# Patient Record
Sex: Male | Born: 1960 | Race: White | Hispanic: No | Marital: Married | Smoking: Former smoker
Health system: Southern US, Community
[De-identification: ages and names within clinical notes are randomized; demographics above are authoritative.]

## PROBLEM LIST (undated history)

## (undated) DIAGNOSIS — I10 Essential (primary) hypertension: Secondary | ICD-10-CM

## (undated) DIAGNOSIS — Z86718 Personal history of other venous thrombosis and embolism: Secondary | ICD-10-CM

## (undated) DIAGNOSIS — G8929 Other chronic pain: Secondary | ICD-10-CM

## (undated) DIAGNOSIS — G629 Polyneuropathy, unspecified: Secondary | ICD-10-CM

## (undated) DIAGNOSIS — G4730 Sleep apnea, unspecified: Secondary | ICD-10-CM

## (undated) DIAGNOSIS — E559 Vitamin D deficiency, unspecified: Secondary | ICD-10-CM

## (undated) DIAGNOSIS — M1990 Unspecified osteoarthritis, unspecified site: Secondary | ICD-10-CM

## (undated) DIAGNOSIS — D751 Secondary polycythemia: Secondary | ICD-10-CM

## (undated) DIAGNOSIS — E785 Hyperlipidemia, unspecified: Secondary | ICD-10-CM

## (undated) DIAGNOSIS — T7840XA Allergy, unspecified, initial encounter: Secondary | ICD-10-CM

## (undated) DIAGNOSIS — K219 Gastro-esophageal reflux disease without esophagitis: Secondary | ICD-10-CM

## (undated) DIAGNOSIS — M48061 Spinal stenosis, lumbar region without neurogenic claudication: Secondary | ICD-10-CM

## (undated) DIAGNOSIS — D689 Coagulation defect, unspecified: Secondary | ICD-10-CM

## (undated) DIAGNOSIS — Z860100 Personal history of colon polyps, unspecified: Secondary | ICD-10-CM

## (undated) DIAGNOSIS — M21371 Foot drop, right foot: Secondary | ICD-10-CM

## (undated) DIAGNOSIS — I872 Venous insufficiency (chronic) (peripheral): Secondary | ICD-10-CM

## (undated) DIAGNOSIS — Z5189 Encounter for other specified aftercare: Secondary | ICD-10-CM

## (undated) DIAGNOSIS — J189 Pneumonia, unspecified organism: Secondary | ICD-10-CM

## (undated) DIAGNOSIS — E538 Deficiency of other specified B group vitamins: Secondary | ICD-10-CM

## (undated) DIAGNOSIS — F32A Depression, unspecified: Secondary | ICD-10-CM

## (undated) DIAGNOSIS — K5900 Constipation, unspecified: Secondary | ICD-10-CM

## (undated) DIAGNOSIS — F329 Major depressive disorder, single episode, unspecified: Secondary | ICD-10-CM

## (undated) DIAGNOSIS — J432 Centrilobular emphysema: Secondary | ICD-10-CM

## (undated) DIAGNOSIS — M549 Dorsalgia, unspecified: Secondary | ICD-10-CM

## (undated) DIAGNOSIS — C801 Malignant (primary) neoplasm, unspecified: Secondary | ICD-10-CM

## (undated) DIAGNOSIS — G2581 Restless legs syndrome: Secondary | ICD-10-CM

## (undated) HISTORY — PX: COLONOSCOPY: SHX174

## (undated) HISTORY — DX: Secondary polycythemia: D75.1

## (undated) HISTORY — DX: Major depressive disorder, single episode, unspecified: F32.9

## (undated) HISTORY — DX: Allergy, unspecified, initial encounter: T78.40XA

## (undated) HISTORY — DX: Dorsalgia, unspecified: M54.9

## (undated) HISTORY — DX: Coagulation defect, unspecified: D68.9

## (undated) HISTORY — DX: Encounter for other specified aftercare: Z51.89

## (undated) HISTORY — DX: Other chronic pain: G89.29

## (undated) HISTORY — DX: Personal history of colonic polyps: Z86.010

## (undated) HISTORY — DX: Depression, unspecified: F32.A

## (undated) HISTORY — DX: Malignant (primary) neoplasm, unspecified: C80.1

## (undated) HISTORY — PX: OTHER SURGICAL HISTORY: SHX169

## (undated) HISTORY — PX: VASECTOMY: SHX75

## (undated) HISTORY — DX: Sleep apnea, unspecified: G47.30

## (undated) HISTORY — PX: HIP SURGERY: SHX245

---
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
## 2006-10-14 HISTORY — PX: ACHILLES TENDON SURGERY: SHX542

## 2006-12-30 ENCOUNTER — Ambulatory Visit (HOSPITAL_BASED_OUTPATIENT_CLINIC_OR_DEPARTMENT_OTHER): Admission: RE | Admit: 2006-12-30 | Discharge: 2006-12-30 | Payer: Self-pay | Admitting: Orthopedic Surgery

## 2008-11-02 ENCOUNTER — Ambulatory Visit: Payer: Self-pay | Admitting: Internal Medicine

## 2008-11-09 LAB — CBC WITH DIFFERENTIAL/PLATELET
BASO%: 0.7 % (ref 0.0–2.0)
Basophils Absolute: 0.1 10*3/uL (ref 0.0–0.1)
EOS%: 0.8 % (ref 0.0–7.0)
Eosinophils Absolute: 0.1 10*3/uL (ref 0.0–0.5)
HCT: 54.1 % — ABNORMAL HIGH (ref 38.7–49.9)
HGB: 18.6 g/dL — ABNORMAL HIGH (ref 13.0–17.1)
LYMPH%: 17.6 % (ref 14.0–48.0)
MCH: 34 pg — ABNORMAL HIGH (ref 28.0–33.4)
MCHC: 34.3 g/dL (ref 32.0–35.9)
MCV: 99.2 fL — ABNORMAL HIGH (ref 81.6–98.0)
MONO#: 0.2 10*3/uL (ref 0.1–0.9)
MONO%: 1.4 % (ref 0.0–13.0)
NEUT#: 11.7 10*3/uL — ABNORMAL HIGH (ref 1.5–6.5)
NEUT%: 79.5 % — ABNORMAL HIGH (ref 40.0–75.0)
Platelets: 131 10*3/uL — ABNORMAL LOW (ref 145–400)
RBC: 5.46 10*6/uL (ref 4.20–5.71)
RDW: 14.8 % — ABNORMAL HIGH (ref 11.2–14.6)
WBC: 14.7 10*3/uL — ABNORMAL HIGH (ref 4.0–10.0)
lymph#: 2.6 10*3/uL (ref 0.9–3.3)

## 2008-11-10 LAB — ERYTHROPOIETIN: Erythropoietin: 7.5 m[IU]/mL (ref 2.6–34.0)

## 2008-11-10 LAB — COMPREHENSIVE METABOLIC PANEL
ALT: 14 U/L (ref 0–53)
AST: 12 U/L (ref 0–37)
Albumin: 4.5 g/dL (ref 3.5–5.2)
Alkaline Phosphatase: 68 U/L (ref 39–117)
BUN: 8 mg/dL (ref 6–23)
CO2: 25 mEq/L (ref 19–32)
Calcium: 9.3 mg/dL (ref 8.4–10.5)
Chloride: 104 mEq/L (ref 96–112)
Creatinine, Ser: 0.83 mg/dL (ref 0.40–1.50)
Glucose, Bld: 104 mg/dL — ABNORMAL HIGH (ref 70–99)
Potassium: 3.8 mEq/L (ref 3.5–5.3)
Sodium: 144 mEq/L (ref 135–145)
Total Bilirubin: 0.5 mg/dL (ref 0.3–1.2)
Total Protein: 7 g/dL (ref 6.0–8.3)

## 2008-11-10 LAB — LACTATE DEHYDROGENASE: LDH: 153 U/L (ref 94–250)

## 2008-11-21 LAB — CBC WITH DIFFERENTIAL/PLATELET
BASO%: 1 % (ref 0.0–2.0)
Basophils Absolute: 0.1 10*3/uL (ref 0.0–0.1)
EOS%: 1.1 % (ref 0.0–7.0)
Eosinophils Absolute: 0.1 10*3/uL (ref 0.0–0.5)
HCT: 51.1 % — ABNORMAL HIGH (ref 38.7–49.9)
HGB: 17.8 g/dL — ABNORMAL HIGH (ref 13.0–17.1)
LYMPH%: 19.4 % (ref 14.0–48.0)
MCH: 34.6 pg — ABNORMAL HIGH (ref 28.0–33.4)
MCHC: 34.8 g/dL (ref 32.0–35.9)
MCV: 99.5 fL — ABNORMAL HIGH (ref 81.6–98.0)
MONO#: 0.3 10*3/uL (ref 0.1–0.9)
MONO%: 2.6 % (ref 0.0–13.0)
NEUT#: 10.3 10*3/uL — ABNORMAL HIGH (ref 1.5–6.5)
NEUT%: 75.9 % — ABNORMAL HIGH (ref 40.0–75.0)
Platelets: 147 10*3/uL (ref 145–400)
RBC: 5.14 10*6/uL (ref 4.20–5.71)
RDW: 15.2 % — ABNORMAL HIGH (ref 11.2–14.6)
WBC: 13.5 10*3/uL — ABNORMAL HIGH (ref 4.0–10.0)
lymph#: 2.6 10*3/uL (ref 0.9–3.3)

## 2008-11-21 LAB — LACTATE DEHYDROGENASE: LDH: 128 U/L (ref 94–250)

## 2008-11-24 LAB — JAK2 EXONS 12 & 13 MUTATION, REFLEX: JAK2 Exons 12 & 13: 13

## 2008-11-24 LAB — JAK2 GENOTYPR

## 2008-11-28 LAB — CBC WITH DIFFERENTIAL/PLATELET
BASO%: 1.2 % (ref 0.0–2.0)
Basophils Absolute: 0.1 10*3/uL (ref 0.0–0.1)
EOS%: 1.3 % (ref 0.0–7.0)
Eosinophils Absolute: 0.1 10*3/uL (ref 0.0–0.5)
HCT: 47.9 % (ref 38.7–49.9)
HGB: 16.6 g/dL (ref 13.0–17.1)
LYMPH%: 27.7 % (ref 14.0–48.0)
MCH: 35 pg — ABNORMAL HIGH (ref 28.0–33.4)
MCHC: 34.8 g/dL (ref 32.0–35.9)
MCV: 100.6 fL — ABNORMAL HIGH (ref 81.6–98.0)
MONO#: 0.2 10*3/uL (ref 0.1–0.9)
MONO%: 1.7 % (ref 0.0–13.0)
NEUT#: 6.5 10*3/uL (ref 1.5–6.5)
NEUT%: 68.1 % (ref 40.0–75.0)
Platelets: 152 10*3/uL (ref 145–400)
RBC: 4.76 10*6/uL (ref 4.20–5.71)
RDW: 16.2 % — ABNORMAL HIGH (ref 11.2–14.6)
WBC: 9.6 10*3/uL (ref 4.0–10.0)
lymph#: 2.7 10*3/uL (ref 0.9–3.3)

## 2008-12-05 LAB — LACTATE DEHYDROGENASE: LDH: 130 U/L (ref 94–250)

## 2008-12-05 LAB — CBC WITH DIFFERENTIAL/PLATELET
BASO%: 1 % (ref 0.0–2.0)
Basophils Absolute: 0.1 10*3/uL (ref 0.0–0.1)
EOS%: 1.5 % (ref 0.0–7.0)
Eosinophils Absolute: 0.2 10*3/uL (ref 0.0–0.5)
HCT: 42.7 % (ref 38.4–49.9)
HGB: 14.9 g/dL (ref 13.0–17.1)
LYMPH%: 28.4 % (ref 14.0–49.0)
MCH: 35.2 pg — ABNORMAL HIGH (ref 27.2–33.4)
MCHC: 34.9 g/dL (ref 32.0–36.0)
MCV: 100.9 fL — ABNORMAL HIGH (ref 79.3–98.0)
MONO#: 0.2 10*3/uL (ref 0.1–0.9)
MONO%: 2 % (ref 0.0–14.0)
NEUT#: 7.3 10*3/uL — ABNORMAL HIGH (ref 1.5–6.5)
NEUT%: 67.1 % (ref 39.0–75.0)
Platelets: 166 10*3/uL (ref 140–400)
RBC: 4.23 10*6/uL (ref 4.20–5.82)
RDW: 15.9 % — ABNORMAL HIGH (ref 11.0–14.6)
WBC: 10.9 10*3/uL — ABNORMAL HIGH (ref 4.0–10.3)
lymph#: 3.1 10*3/uL (ref 0.9–3.3)

## 2008-12-23 ENCOUNTER — Ambulatory Visit: Payer: Self-pay | Admitting: Internal Medicine

## 2008-12-23 LAB — CBC WITH DIFFERENTIAL/PLATELET
BASO%: 1.1 % (ref 0.0–2.0)
Basophils Absolute: 0.1 10*3/uL (ref 0.0–0.1)
EOS%: 1.8 % (ref 0.0–7.0)
Eosinophils Absolute: 0.2 10*3/uL (ref 0.0–0.5)
HCT: 45.7 % (ref 38.4–49.9)
HGB: 16.1 g/dL (ref 13.0–17.1)
LYMPH%: 27.7 % (ref 14.0–49.0)
MCH: 34.9 pg — ABNORMAL HIGH (ref 27.2–33.4)
MCHC: 35.2 g/dL (ref 32.0–36.0)
MCV: 99.3 fL — ABNORMAL HIGH (ref 79.3–98.0)
MONO#: 0.3 10*3/uL (ref 0.1–0.9)
MONO%: 3.1 % (ref 0.0–14.0)
NEUT#: 7.3 10*3/uL — ABNORMAL HIGH (ref 1.5–6.5)
NEUT%: 66.3 % (ref 39.0–75.0)
Platelets: 149 10*3/uL (ref 140–400)
RBC: 4.6 10*6/uL (ref 4.20–5.82)
RDW: 14.4 % (ref 11.0–14.6)
WBC: 10.9 10*3/uL — ABNORMAL HIGH (ref 4.0–10.3)
lymph#: 3 10*3/uL (ref 0.9–3.3)

## 2009-01-02 LAB — CBC WITH DIFFERENTIAL/PLATELET
BASO%: 0.5 % (ref 0.0–2.0)
Basophils Absolute: 0 10*3/uL (ref 0.0–0.1)
EOS%: 2.2 % (ref 0.0–7.0)
Eosinophils Absolute: 0.2 10*3/uL (ref 0.0–0.5)
HCT: 41.4 % (ref 38.4–49.9)
HGB: 14.5 g/dL (ref 13.0–17.1)
LYMPH%: 30.9 % (ref 14.0–49.0)
MCH: 34.5 pg — ABNORMAL HIGH (ref 27.2–33.4)
MCHC: 35 g/dL (ref 32.0–36.0)
MCV: 98.3 fL — ABNORMAL HIGH (ref 79.3–98.0)
MONO#: 0.2 10*3/uL (ref 0.1–0.9)
MONO%: 2.6 % (ref 0.0–14.0)
NEUT#: 5.9 10*3/uL (ref 1.5–6.5)
NEUT%: 63.8 % (ref 39.0–75.0)
Platelets: 176 10*3/uL (ref 140–400)
RBC: 4.21 10*6/uL (ref 4.20–5.82)
RDW: 13.9 % (ref 11.0–14.6)
WBC: 9.2 10*3/uL (ref 4.0–10.3)
lymph#: 2.9 10*3/uL (ref 0.9–3.3)

## 2009-01-02 LAB — LACTATE DEHYDROGENASE: LDH: 138 U/L (ref 94–250)

## 2009-01-30 LAB — CBC WITH DIFFERENTIAL/PLATELET
BASO%: 1 % (ref 0.0–2.0)
Basophils Absolute: 0.1 10*3/uL (ref 0.0–0.1)
EOS%: 3.1 % (ref 0.0–7.0)
Eosinophils Absolute: 0.3 10*3/uL (ref 0.0–0.5)
HCT: 45.9 % (ref 38.4–49.9)
HGB: 15.8 g/dL (ref 13.0–17.1)
LYMPH%: 27.2 % (ref 14.0–49.0)
MCH: 33.1 pg (ref 27.2–33.4)
MCHC: 34.4 g/dL (ref 32.0–36.0)
MCV: 96.4 fL (ref 79.3–98.0)
MONO#: 0.3 10*3/uL (ref 0.1–0.9)
MONO%: 3.3 % (ref 0.0–14.0)
NEUT#: 5.8 10*3/uL (ref 1.5–6.5)
NEUT%: 65.4 % (ref 39.0–75.0)
Platelets: 154 10*3/uL (ref 140–400)
RBC: 4.76 10*6/uL (ref 4.20–5.82)
RDW: 12.5 % (ref 11.0–14.6)
WBC: 8.9 10*3/uL (ref 4.0–10.3)
lymph#: 2.4 10*3/uL (ref 0.9–3.3)

## 2009-02-23 ENCOUNTER — Ambulatory Visit: Payer: Self-pay | Admitting: Internal Medicine

## 2009-02-27 LAB — CBC WITH DIFFERENTIAL/PLATELET
BASO%: 0.5 % (ref 0.0–2.0)
Basophils Absolute: 0 10*3/uL (ref 0.0–0.1)
EOS%: 3.3 % (ref 0.0–7.0)
Eosinophils Absolute: 0.3 10*3/uL (ref 0.0–0.5)
HCT: 45.1 % (ref 38.4–49.9)
HGB: 15.6 g/dL (ref 13.0–17.1)
LYMPH%: 32.2 % (ref 14.0–49.0)
MCH: 32.4 pg (ref 27.2–33.4)
MCHC: 34.6 g/dL (ref 32.0–36.0)
MCV: 93.5 fL (ref 79.3–98.0)
MONO#: 0.3 10*3/uL (ref 0.1–0.9)
MONO%: 4.1 % (ref 0.0–14.0)
NEUT#: 4.9 10*3/uL (ref 1.5–6.5)
NEUT%: 59.9 % (ref 39.0–75.0)
Platelets: 140 10*3/uL (ref 140–400)
RBC: 4.83 10*6/uL (ref 4.20–5.82)
RDW: 12.6 % (ref 11.0–14.6)
WBC: 8.2 10*3/uL (ref 4.0–10.3)
lymph#: 2.6 10*3/uL (ref 0.9–3.3)

## 2009-03-27 LAB — CBC WITH DIFFERENTIAL/PLATELET
BASO%: 0.5 % (ref 0.0–2.0)
Basophils Absolute: 0 10*3/uL (ref 0.0–0.1)
EOS%: 2.8 % (ref 0.0–7.0)
Eosinophils Absolute: 0.3 10*3/uL (ref 0.0–0.5)
HCT: 46.8 % (ref 38.4–49.9)
HGB: 16.8 g/dL (ref 13.0–17.1)
LYMPH%: 35 % (ref 14.0–49.0)
MCH: 32.8 pg (ref 27.2–33.4)
MCHC: 36 g/dL (ref 32.0–36.0)
MCV: 91.1 fL (ref 79.3–98.0)
MONO#: 0.4 10*3/uL (ref 0.1–0.9)
MONO%: 3.8 % (ref 0.0–14.0)
NEUT#: 5.3 10*3/uL (ref 1.5–6.5)
NEUT%: 57.9 % (ref 39.0–75.0)
Platelets: 142 10*3/uL (ref 140–400)
RBC: 5.14 10*6/uL (ref 4.20–5.82)
RDW: 12.6 % (ref 11.0–14.6)
WBC: 9.2 10*3/uL (ref 4.0–10.3)
lymph#: 3.2 10*3/uL (ref 0.9–3.3)

## 2009-03-27 LAB — LACTATE DEHYDROGENASE: LDH: 158 U/L (ref 94–250)

## 2009-04-04 ENCOUNTER — Ambulatory Visit: Payer: Self-pay | Admitting: Internal Medicine

## 2009-05-04 ENCOUNTER — Ambulatory Visit: Payer: Self-pay | Admitting: Internal Medicine

## 2009-05-04 LAB — CBC WITH DIFFERENTIAL/PLATELET
BASO%: 0.5 % (ref 0.0–2.0)
Basophils Absolute: 0 10*3/uL (ref 0.0–0.1)
EOS%: 1.7 % (ref 0.0–7.0)
Eosinophils Absolute: 0.2 10*3/uL (ref 0.0–0.5)
HCT: 46.3 % (ref 38.4–49.9)
HGB: 16.1 g/dL (ref 13.0–17.1)
LYMPH%: 26.1 % (ref 14.0–49.0)
MCH: 31.4 pg (ref 27.2–33.4)
MCHC: 34.9 g/dL (ref 32.0–36.0)
MCV: 89.9 fL (ref 79.3–98.0)
MONO#: 0.4 10*3/uL (ref 0.1–0.9)
MONO%: 4.1 % (ref 0.0–14.0)
NEUT#: 6.7 10*3/uL — ABNORMAL HIGH (ref 1.5–6.5)
NEUT%: 67.6 % (ref 39.0–75.0)
Platelets: 145 10*3/uL (ref 140–400)
RBC: 5.14 10*6/uL (ref 4.20–5.82)
RDW: 13.2 % (ref 11.0–14.6)
WBC: 9.9 10*3/uL (ref 4.0–10.3)
lymph#: 2.6 10*3/uL (ref 0.9–3.3)

## 2009-05-04 LAB — LACTATE DEHYDROGENASE: LDH: 192 U/L (ref 94–250)

## 2009-06-29 ENCOUNTER — Ambulatory Visit: Payer: Self-pay | Admitting: Internal Medicine

## 2009-07-03 LAB — CBC WITH DIFFERENTIAL/PLATELET
BASO%: 0.9 % (ref 0.0–2.0)
Basophils Absolute: 0.1 10*3/uL (ref 0.0–0.1)
EOS%: 1.9 % (ref 0.0–7.0)
Eosinophils Absolute: 0.1 10*3/uL (ref 0.0–0.5)
HCT: 45.9 % (ref 38.4–49.9)
HGB: 16.2 g/dL (ref 13.0–17.1)
LYMPH%: 30.5 % (ref 14.0–49.0)
MCH: 31.5 pg (ref 27.2–33.4)
MCHC: 35.3 g/dL (ref 32.0–36.0)
MCV: 89.3 fL (ref 79.3–98.0)
MONO#: 0.3 10*3/uL (ref 0.1–0.9)
MONO%: 5 % (ref 0.0–14.0)
NEUT#: 3.9 10*3/uL (ref 1.5–6.5)
NEUT%: 61.7 % (ref 39.0–75.0)
Platelets: 137 10*3/uL — ABNORMAL LOW (ref 140–400)
RBC: 5.14 10*6/uL (ref 4.20–5.82)
RDW: 13.4 % (ref 11.0–14.6)
WBC: 6.3 10*3/uL (ref 4.0–10.3)
lymph#: 1.9 10*3/uL (ref 0.9–3.3)

## 2009-07-03 LAB — LACTATE DEHYDROGENASE: LDH: 195 U/L (ref 94–250)

## 2009-09-28 ENCOUNTER — Ambulatory Visit: Payer: Self-pay | Admitting: Internal Medicine

## 2009-10-02 LAB — CBC WITH DIFFERENTIAL/PLATELET
BASO%: 0.5 % (ref 0.0–2.0)
Basophils Absolute: 0 10*3/uL (ref 0.0–0.1)
EOS%: 2 % (ref 0.0–7.0)
Eosinophils Absolute: 0.2 10*3/uL (ref 0.0–0.5)
HCT: 46.1 % (ref 38.4–49.9)
HGB: 16 g/dL (ref 13.0–17.1)
LYMPH%: 35.8 % (ref 14.0–49.0)
MCH: 31.5 pg (ref 27.2–33.4)
MCHC: 34.6 g/dL (ref 32.0–36.0)
MCV: 91.2 fL (ref 79.3–98.0)
MONO#: 0.2 10*3/uL (ref 0.1–0.9)
MONO%: 2.5 % (ref 0.0–14.0)
NEUT#: 5.2 10*3/uL (ref 1.5–6.5)
NEUT%: 59.2 % (ref 39.0–75.0)
Platelets: 148 10*3/uL (ref 140–400)
RBC: 5.06 10*6/uL (ref 4.20–5.82)
RDW: 13.6 % (ref 11.0–14.6)
WBC: 8.7 10*3/uL (ref 4.0–10.3)
lymph#: 3.1 10*3/uL (ref 0.9–3.3)

## 2009-10-02 LAB — LACTATE DEHYDROGENASE: LDH: 172 U/L (ref 94–250)

## 2009-11-29 ENCOUNTER — Ambulatory Visit: Payer: Self-pay | Admitting: Internal Medicine

## 2009-12-04 LAB — CBC WITH DIFFERENTIAL/PLATELET
BASO%: 0.7 % (ref 0.0–2.0)
Basophils Absolute: 0.1 10*3/uL (ref 0.0–0.1)
EOS%: 1.4 % (ref 0.0–7.0)
Eosinophils Absolute: 0.1 10*3/uL (ref 0.0–0.5)
HCT: 45.8 % (ref 38.4–49.9)
HGB: 16.1 g/dL (ref 13.0–17.1)
LYMPH%: 40.5 % (ref 14.0–49.0)
MCH: 32.4 pg (ref 27.2–33.4)
MCHC: 35.2 g/dL (ref 32.0–36.0)
MCV: 92 fL (ref 79.3–98.0)
MONO#: 0.4 10*3/uL (ref 0.1–0.9)
MONO%: 4.5 % (ref 0.0–14.0)
NEUT#: 4.6 10*3/uL (ref 1.5–6.5)
NEUT%: 52.9 % (ref 39.0–75.0)
Platelets: 139 10*3/uL — ABNORMAL LOW (ref 140–400)
RBC: 4.98 10*6/uL (ref 4.20–5.82)
RDW: 13.7 % (ref 11.0–14.6)
WBC: 8.7 10*3/uL (ref 4.0–10.3)
lymph#: 3.5 10*3/uL — ABNORMAL HIGH (ref 0.9–3.3)

## 2009-12-04 LAB — LACTATE DEHYDROGENASE: LDH: 175 U/L (ref 94–250)

## 2010-02-23 ENCOUNTER — Ambulatory Visit: Payer: Self-pay | Admitting: Internal Medicine

## 2010-02-26 LAB — CBC WITH DIFFERENTIAL/PLATELET
BASO%: 0.4 % (ref 0.0–2.0)
Basophils Absolute: 0 10*3/uL (ref 0.0–0.1)
EOS%: 1.7 % (ref 0.0–7.0)
Eosinophils Absolute: 0.1 10*3/uL (ref 0.0–0.5)
HCT: 44.8 % (ref 38.4–49.9)
HGB: 15.7 g/dL (ref 13.0–17.1)
LYMPH%: 36.5 % (ref 14.0–49.0)
MCH: 32.1 pg (ref 27.2–33.4)
MCHC: 35.2 g/dL (ref 32.0–36.0)
MCV: 91.3 fL (ref 79.3–98.0)
MONO#: 0.3 10*3/uL (ref 0.1–0.9)
MONO%: 3.5 % (ref 0.0–14.0)
NEUT#: 4.4 10*3/uL (ref 1.5–6.5)
NEUT%: 57.9 % (ref 39.0–75.0)
Platelets: 137 10*3/uL — ABNORMAL LOW (ref 140–400)
RBC: 4.9 10*6/uL (ref 4.20–5.82)
RDW: 13.1 % (ref 11.0–14.6)
WBC: 7.6 10*3/uL (ref 4.0–10.3)
lymph#: 2.8 10*3/uL (ref 0.9–3.3)

## 2010-02-26 LAB — LACTATE DEHYDROGENASE: LDH: 172 U/L (ref 94–250)

## 2011-01-04 ENCOUNTER — Emergency Department: Payer: Self-pay | Admitting: Emergency Medicine

## 2011-01-16 LAB — LIPID PANEL
Cholesterol: 165 mg/dL (ref 0–200)
HDL: 31 mg/dL — AB (ref 35–70)
LDL Cholesterol: 108 mg/dL
Triglycerides: 130 mg/dL (ref 40–160)

## 2011-03-01 NOTE — Op Note (Signed)
NAME:  Ralph [name], Ralph [name]                 ACCOUNT NO.:  [id number]   MEDICAL RECORD NO.:  [id number]          PATIENT TYPE:  AMB   LOCATION:  [facility]                          FACILITY:  [facility]   PHYSICIAN:  Leonides Grills, M.D.     DATE OF BIRTH:  October 24, 1960   DATE OF PROCEDURE:  12/30/2006  DATE OF DISCHARGE:  12/30/2006                               OPERATIVE REPORT   PREOPERATIVE DIAGNOSIS:  Right tight Achilles tendon.   POSTOPERATIVE DIAGNOSIS:  Right tight Achilles tendon.   OPERATION:  Right percutaneous tendoachilles lengthening.   ANESTHESIA:  General.   SURGEON:  Leonides Grills, MD.   ASSISTANT:  Evlyn Kanner, P.A.-C.   ESTIMATED BLOOD LOSS:  Minimal.   TOURNIQUET:  None.   COMPLICATIONS:  None.   DISPOSITION:  Stable to PR.   INDICATIONS:  This gentleman has had a tight heel cord.  He was  consented for the above procedure.  All risks which include infection,  nerve or vessel injury, Achilles tendon rupture, persistent, worsening,  prolonged recovery, stiffness were all explained.  Questions were  encouraged answered.   DESCRIPTION OF PROCEDURE:  The patient brought to the operating room,  placed in supine position.  Initially after adequate general  endotracheal tube anesthesia was administered as well as Ancef 1 g IV  piggyback, right lower extremity was then prepped and draped in sterile  manner.  No tourniquet was used.  Two medial and one lateral  hemisections of the Achilles tendon with 11 blade scalpel was then made.  This had excellent release of the tight Achilles tendon.  The area was  copiously irrigated with normal saline.  Sterile dressing was applied.  Cam Walker boot was applied.  The patient was stable to PR.   POSTOPERATIVE COURSE:  The patient will follow up in 2 weeks.  At that  time, we will remove the dressing as well as suture.  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  At  that point, he will go into a normal  shoe and progress slowly,  activities as tolerated.      Leonides Grills, M.D.  Electronically Signed     PB/MEDQ  D:  01/12/2007  T:  01/12/2007  Job:  [id number]

## 2011-06-12 ENCOUNTER — Other Ambulatory Visit: Payer: Self-pay | Admitting: Internal Medicine

## 2011-06-12 DIAGNOSIS — M4316 Spondylolisthesis, lumbar region: Secondary | ICD-10-CM

## 2011-06-21 ENCOUNTER — Inpatient Hospital Stay: Admission: RE | Admit: 2011-06-21 | Payer: Self-pay | Source: Ambulatory Visit

## 2011-07-03 ENCOUNTER — Ambulatory Visit
Admission: RE | Admit: 2011-07-03 | Discharge: 2011-07-03 | Disposition: A | Discharge status: Home / Self Care | Payer: BC Managed Care – PPO | Source: Ambulatory Visit | Attending: Internal Medicine | Admitting: Internal Medicine

## 2011-07-03 DIAGNOSIS — M4316 Spondylolisthesis, lumbar region: Secondary | ICD-10-CM

## 2011-08-13 LAB — CBC AND DIFFERENTIAL: HCT: 51 % (ref 41–53)

## 2011-11-10 DIAGNOSIS — M21371 Foot drop, right foot: Secondary | ICD-10-CM | POA: Insufficient documentation

## 2011-11-10 DIAGNOSIS — E785 Hyperlipidemia, unspecified: Secondary | ICD-10-CM | POA: Insufficient documentation

## 2011-11-10 DIAGNOSIS — F32A Depression, unspecified: Secondary | ICD-10-CM | POA: Insufficient documentation

## 2011-11-10 DIAGNOSIS — K5909 Other constipation: Secondary | ICD-10-CM | POA: Insufficient documentation

## 2011-11-10 DIAGNOSIS — F329 Major depressive disorder, single episode, unspecified: Secondary | ICD-10-CM

## 2011-11-10 DIAGNOSIS — K219 Gastro-esophageal reflux disease without esophagitis: Secondary | ICD-10-CM

## 2011-11-10 DIAGNOSIS — G2581 Restless legs syndrome: Secondary | ICD-10-CM | POA: Insufficient documentation

## 2011-11-10 DIAGNOSIS — D751 Secondary polycythemia: Secondary | ICD-10-CM | POA: Insufficient documentation

## 2011-11-25 ENCOUNTER — Ambulatory Visit (INDEPENDENT_AMBULATORY_CARE_PROVIDER_SITE_OTHER): Payer: BC Managed Care – PPO | Admitting: Family Medicine

## 2011-11-25 ENCOUNTER — Encounter: Payer: Self-pay | Admitting: Family Medicine

## 2011-11-25 DIAGNOSIS — J329 Chronic sinusitis, unspecified: Secondary | ICD-10-CM

## 2011-11-25 DIAGNOSIS — F32A Depression, unspecified: Secondary | ICD-10-CM

## 2011-11-25 DIAGNOSIS — M21371 Foot drop, right foot: Secondary | ICD-10-CM

## 2011-11-25 DIAGNOSIS — D751 Secondary polycythemia: Secondary | ICD-10-CM

## 2011-11-25 DIAGNOSIS — F329 Major depressive disorder, single episode, unspecified: Secondary | ICD-10-CM

## 2011-11-25 DIAGNOSIS — Z0000 Encounter for general adult medical examination without abnormal findings: Secondary | ICD-10-CM

## 2011-11-25 DIAGNOSIS — E785 Hyperlipidemia, unspecified: Secondary | ICD-10-CM

## 2011-11-25 DIAGNOSIS — F411 Generalized anxiety disorder: Secondary | ICD-10-CM

## 2011-11-25 DIAGNOSIS — M7702 Medial epicondylitis, left elbow: Secondary | ICD-10-CM

## 2011-11-25 LAB — POCT CBC
Granulocyte percent: 56.2 %G (ref 37–80)
HCT, POC: 50.3 % (ref 43.5–53.7)
Hemoglobin: 17.1 g/dL (ref 14.1–18.1)
Lymph, poc: 2.5 (ref 0.6–3.4)
MCH, POC: 32.3 pg — AB (ref 27–31.2)
MCHC: 34 g/dL (ref 31.8–35.4)
MCV: 95 fL (ref 80–97)
MID (cbc): 0.5 (ref 0–0.9)
MPV: 8.6 fL (ref 0–99.8)
POC Granulocyte: 3.8 (ref 2–6.9)
POC LYMPH PERCENT: 36.4 %L (ref 10–50)
POC MID %: 7.4 %M (ref 0–12)
Platelet Count, POC: 188 10*3/uL (ref 142–424)
RBC: 5.3 M/uL (ref 4.69–6.13)
RDW, POC: 14.2 %
WBC: 6.8 10*3/uL (ref 4.6–10.2)

## 2011-11-25 LAB — TSH: TSH: 1.122 u[IU]/mL (ref 0.350–4.500)

## 2011-11-25 MED ORDER — AZITHROMYCIN 250 MG PO TABS: ORAL_TABLET | ORAL | Status: AC

## 2011-11-25 NOTE — Progress Notes (Signed)
  Subjective:    Patient ID: Ralph [name], male    DOB: [birth year], 51 y.o.   MRN: [id number]  HPI  Patient presents for CPE  See blue intake form(to be scanned)  1) C/o (L) elbow pain in the evening. Uses computer mouse throughout day.  Have not yet tried NSAIDS.  2) C/o green nasal discharge that is occ blood tinged.  Denies facial or dental pain.  Review of Systems     Objective:   Physical Exam  Constitutional: He is oriented to person, place, and time. He appears well-developed and well-nourished.  HENT:  Head: Normocephalic and atraumatic.  Right Ear: External ear normal.  Left Ear: External ear normal.  Nose: Nose normal.  Mouth/Throat: Oropharynx is clear and moist.  Eyes: Conjunctivae and EOM are normal. Pupils are equal, round, and reactive to light.  Neck: Normal range of motion. Neck supple. Carotid bruit is not present. No thyromegaly present.  Cardiovascular: Normal rate, regular rhythm, normal heart sounds and intact distal pulses.   Pulmonary/Chest: Effort normal and breath sounds normal.  Abdominal: Soft. Bowel sounds are normal. He exhibits no mass. There is no hepatosplenomegaly. There is no tenderness.  Genitourinary: Prostate is enlarged (1+ enlarged without nodules).  Lymphadenopathy:    He has no cervical adenopathy.  Neurological: He is alert and oriented to person, place, and time. He exhibits abnormal muscle tone.       (R) foot drop  Skin: Skin is warm.  Psychiatric: He has a normal mood and affect.    Results for orders placed in visit on 11/25/11  POCT CBC      Component Value Range   WBC 6.8  4.6 - 10.2 (K/uL)   Lymph, poc 2.5  0.6 - 3.4    POC LYMPH PERCENT 36.4  10 - 50 (%L)   MID (cbc) 0.5  0 - 0.9    POC MID % 7.4  0 - 12 (%M)   POC Granulocyte 3.8  2 - 6.9    Granulocyte percent 56.2  37 - 80 (%G)   RBC 5.30  4.69 - 6.13 (M/uL)   Hemoglobin 17.1  14.1 - 18.1 (g/dL)   HCT, POC 40.9  81.1 - 53.7 (%)   MCV 95.0  80 - 97 (fL)   MCH,  POC 32.3 (*) 27 - 31.2 (pg)   MCHC 34.0  31.8 - 35.4 (g/dL)   RDW, POC 91.4     Platelet Count, POC 188  142 - 424 (K/uL)   MPV 8.6  0 - 99.8 (fL)            1. Routine general medical examination at a health care facility  PSA  2. Polycythemia  POCT CBC, patient to contact Dr. Shirline Frees for phlebotomy given current HCT  3. Depression  TSH  4. Dyslipidemia  Comprehensive metabolic panel, Lipid panel  5. Sinusitis  azithromycin (ZITHROMAX) 250 MG tablet  6. Medial epicondylitis of left elbow  Trial of Advil INB RTC  7. Foot drop, right  chronic   Anticipatory guidance

## 2011-11-26 LAB — LIPID PANEL
Cholesterol: 174 mg/dL (ref 0–200)
HDL: 41 mg/dL (ref >39)
LDL Cholesterol: 111 mg/dL — ABNORMAL HIGH (ref 0–99)
Total CHOL/HDL Ratio: 4.2 Ratio
Triglycerides: 110 mg/dL (ref <150)
VLDL: 22 mg/dL (ref 0–40)

## 2011-11-26 LAB — PSA: PSA: 2.35 ng/mL (ref <=4.00)

## 2011-11-26 LAB — COMPREHENSIVE METABOLIC PANEL
ALT: 14 U/L (ref 0–53)
AST: 17 U/L (ref 0–37)
Albumin: 4.5 g/dL (ref 3.5–5.2)
Alkaline Phosphatase: 83 U/L (ref 39–117)
BUN: 10 mg/dL (ref 6–23)
CO2: 24 mEq/L (ref 19–32)
Calcium: 9 mg/dL (ref 8.4–10.5)
Chloride: 103 mEq/L (ref 96–112)
Creat: 1 mg/dL (ref 0.50–1.35)
Glucose, Bld: 115 mg/dL — ABNORMAL HIGH (ref 70–99)
Potassium: 4.3 mEq/L (ref 3.5–5.3)
Sodium: 140 mEq/L (ref 135–145)
Total Bilirubin: 0.7 mg/dL (ref 0.3–1.2)
Total Protein: 6.8 g/dL (ref 6.0–8.3)

## 2011-11-28 ENCOUNTER — Telehealth: Payer: Self-pay | Admitting: Internal Medicine

## 2011-11-28 ENCOUNTER — Other Ambulatory Visit: Payer: Self-pay | Admitting: *Deleted

## 2011-11-28 DIAGNOSIS — D45 Polycythemia vera: Secondary | ICD-10-CM

## 2011-11-28 NOTE — Telephone Encounter (Signed)
l/m with 3/14 appt     aom

## 2011-11-28 NOTE — Progress Notes (Signed)
Pt called stating that he had a CBC done at his PCP office and his hbg was 17.1.  Pt last seen by Dr Donnald Garre in 2011.  Per Dr Donnald Garre okay to schedule lab and f/u appt in March 2013.  SLJ

## 2011-11-28 NOTE — Progress Notes (Signed)
Quick Note:  Please send patient copy of normal labs. Thanks! KR  ______ 

## 2011-12-26 ENCOUNTER — Ambulatory Visit (HOSPITAL_BASED_OUTPATIENT_CLINIC_OR_DEPARTMENT_OTHER): Payer: BC Managed Care – PPO | Admitting: Internal Medicine

## 2011-12-26 ENCOUNTER — Other Ambulatory Visit (HOSPITAL_BASED_OUTPATIENT_CLINIC_OR_DEPARTMENT_OTHER): Payer: BC Managed Care – PPO | Admitting: Lab

## 2011-12-26 VITALS — BP 132/87 | HR 77 | Temp 97.3°F | Ht 70.0 in | Wt 212.9 lb

## 2011-12-26 DIAGNOSIS — D751 Secondary polycythemia: Secondary | ICD-10-CM

## 2011-12-26 DIAGNOSIS — D45 Polycythemia vera: Secondary | ICD-10-CM

## 2011-12-26 LAB — COMPREHENSIVE METABOLIC PANEL
ALT: 14 U/L (ref 0–53)
AST: 14 U/L (ref 0–37)
Albumin: 4.4 g/dL (ref 3.5–5.2)
Alkaline Phosphatase: 84 U/L (ref 39–117)
BUN: 10 mg/dL (ref 6–23)
CO2: 27 mEq/L (ref 19–32)
Calcium: 9.1 mg/dL (ref 8.4–10.5)
Chloride: 103 mEq/L (ref 96–112)
Creatinine, Ser: 1.21 mg/dL (ref 0.50–1.35)
Glucose, Bld: 106 mg/dL — ABNORMAL HIGH (ref 70–99)
Potassium: 4.2 mEq/L (ref 3.5–5.3)
Sodium: 139 mEq/L (ref 135–145)
Total Bilirubin: 0.5 mg/dL (ref 0.3–1.2)
Total Protein: 6.4 g/dL (ref 6.0–8.3)

## 2011-12-26 LAB — CBC WITH DIFFERENTIAL/PLATELET
BASO%: 1.8 % (ref 0.0–2.0)
Basophils Absolute: 0.1 10*3/uL (ref 0.0–0.1)
EOS%: 1.5 % (ref 0.0–7.0)
Eosinophils Absolute: 0.1 10*3/uL (ref 0.0–0.5)
HCT: 47.4 % (ref 38.4–49.9)
HGB: 16.5 g/dL (ref 13.0–17.1)
LYMPH%: 35.1 % (ref 14.0–49.0)
MCH: 32.8 pg (ref 27.2–33.4)
MCHC: 34.8 g/dL (ref 32.0–36.0)
MCV: 94.3 fL (ref 79.3–98.0)
MONO#: 0.2 10*3/uL (ref 0.1–0.9)
MONO%: 2.9 % (ref 0.0–14.0)
NEUT#: 4.4 10*3/uL (ref 1.5–6.5)
NEUT%: 58.7 % (ref 39.0–75.0)
Platelets: 148 10*3/uL (ref 140–400)
RBC: 5.03 10*6/uL (ref 4.20–5.82)
RDW: 13.5 % (ref 11.0–14.6)
WBC: 7.6 10*3/uL (ref 4.0–10.3)
lymph#: 2.7 10*3/uL (ref 0.9–3.3)
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
## 2012-02-15 ENCOUNTER — Other Ambulatory Visit: Payer: Self-pay | Admitting: Physician Assistant

## 2012-02-23 ENCOUNTER — Other Ambulatory Visit: Payer: Self-pay | Admitting: Physician Assistant

## 2012-03-16 ENCOUNTER — Ambulatory Visit (INDEPENDENT_AMBULATORY_CARE_PROVIDER_SITE_OTHER): Payer: BC Managed Care – PPO | Admitting: Family Medicine

## 2012-03-16 VITALS — BP 132/77 | HR 84 | Temp 98.5°F | Resp 18 | Ht 70.0 in | Wt 213.0 lb

## 2012-03-16 DIAGNOSIS — T148 Other injury of unspecified body region: Secondary | ICD-10-CM

## 2012-03-16 DIAGNOSIS — W57XXXA Bitten or stung by nonvenomous insect and other nonvenomous arthropods, initial encounter: Secondary | ICD-10-CM

## 2012-03-16 MED ORDER — DOXYCYCLINE HYCLATE 100 MG PO TABS
100.0000 mg | ORAL_TABLET | Freq: Two times a day (BID) | ORAL | Status: AC
Start: 2012-03-16 — End: 2012-03-26

## 2012-03-16 NOTE — Patient Instructions (Signed)
Wood Tick Bite Ticks are insects that attach themselves to the skin. Most tick bites are harmless, but sometimes ticks carry diseases that can make a person quite ill. The chance of getting ill depends on:  The kind of tick that bites you.   Time of year.   How long the tick is attached.   Geographic location.  Wood ticks are also called dog ticks. They are generally black. They can have white markings. They live in shrubs and grassy areas. They are larger than deer ticks. Wood ticks are about the size of a watermelon seed. They have a hard body. The most common places for ticks to attach themselves are the scalp, neck, armpits, waist, and groin. Wood ticks may stay attached for up to 2 weeks. TICKS MUST BE REMOVED AS SOON AS POSSIBLE TO HELP PREVENT DISEASES CAUSED BY TICK BITES.  TO REMOVE A TICK: 1. If available, put on latex gloves before trying to remove a tick.  2. Grasp the tick as close to the skin as possible, with curved forceps, fine tweezers or a special tick removal tool.  3. Pull gently with steady pressure until the tick lets go. Do not twist the tick or jerk it suddenly. This may break off the tick's head or mouth parts.  4. Do not crush the tick's body. This could force disease-carrying fluids from the tick into your body.  5. After the tick is removed, wash the bite area and your hands with soap and water or other disinfectant.  6. Apply a small amount of antiseptic cream or ointment to the bite site.  7. Wash and disinfect any instruments that were used.  8. Save the tick in a jar or plastic bag for later identification. Preserve the tick with a bit of alcohol or put it in the freezer.  9. Do not apply a hot match, petroleum jelly, or fingernail polish to the tick. This does not work and may increase the chances of disease from the tick bite.  YOU MAY NEED TO SEE YOUR CAREGIVER FOR A TETANUS SHOT NOW IF:  You have no idea when you had the last one.   You have never had a  tetanus shot before.  If you need a tetanus shot, and you decide not to get one, there is a rare chance of getting tetanus. Sickness from tetanus can be serious. If you get a tetanus shot, your arm may swell, get red and warm to the touch at the shot site. This is common and not a problem. PREVENTION  Wear protective clothing. Long sleeves and pants are best.   Wear white clothes to see ticks more easily   Tuck your pant legs into your socks.   If walking on trail, stay in the middle of the trail to avoid brushing against bushes.   Put insect repellent on all exposed skin and along boot tops, pant legs and sleeve cuffs   Check clothing, hair and skin repeatedly and before coming inside.   Brush off any ticks that are not attached.  SEEK MEDICAL CARE IF:   You cannot remove a tick or part of the tick that is left in the skin.   Unexplained fever.   Redness and swelling in the area of the tick bite.   Tender, swollen lymph glands.   Diarrhea.   Weight loss.   Cough.   Fatigue.   Muscle, joint or bone pain.   Belly pain.   Headache.   Rash.    SEEK IMMEDIATE MEDICAL CARE IF:   You develop an oral temperature above 102 F (38.9 C).   You are having trouble walking or moving your legs.   Numbness in the legs.   Shortness of breath.   Confusion.   Repeated vomiting.  Document Released: 09/27/2000 Document Revised: 09/19/2011 Document Reviewed: 09/05/2008 [employer] Patient Information 2012 [employer].  Deer Tick Bite Deer ticks are brown arachnids (spider family) that vary in size from as small as the head of a pin to 1/4 inch (1/2 cm) diameter. They thrive in wooded areas. Deer are the preferred host of adult deer ticks. Small rodents are the host of young ticks (nymphs). When a person walks in a field or wooded area, young and adult ticks in the surrounding grass and vegetation can attach themselves to the skin. They can suck blood for hours to days if  unnoticed. Ticks are found all over the [place] Some ticks carry a specific bacteria (Borrelia burgdorferi) that causes an infection called Lyme disease. The bacteria is typically passed into a person during the blood sucking process. This happens after the tick has been attached for at least a number of hours. While ticks can be found all over the [place] those carrying the bacteria that causes Lyme disease are most common in [place] and the [place]. Only a small proportion of ticks in these areas carry the Lyme disease bacteria and cause human infections. Ticks usually attach to warm spots on the body, such as the:  Head.   Back.   Neck.   Armpits.   Groin.  SYMPTOMS  Most of the time, a deer tick bite will not be felt. You may or may not see the attached tick. You may notice mild irritation or redness around the bite site. If the deer tick passes the Lyme disease bacteria to a person, a round, red rash may be noticed 2 to 3 days after the bite. The rash may be clear in the middle, like a bull's-eye or target. If not treated, other symptoms may develop several days to weeks after the onset of the rash. These symptoms may include:  New rash lesions.   Fatigue and weakness.   General ill feeling and achiness.   Chills.   Headache and neck pain.   Swollen lymph glands.   Sore muscles and joints.  5 to 15% of untreated people with Lyme disease may develop more severe illnesses after several weeks to months. This may include inflammation of the brain lining (meningitis), nerve palsies, an abnormal heartbeat, or severe muscle and joint pain and inflammation (myositis or arthritis). DIAGNOSIS   Physical exam and medical history.   Viewing the tick if it was saved for confirmation.   Blood tests (to check or confirm the presence of Lyme disease).  TREATMENT  Most ticks do not carry disease. If found, an attached tick should be removed using tweezers. Tweezers should be placed under  the body of the tick so it is removed by its attachment parts (pincers). If there are signs or symptoms of being sick, or Lyme disease is confirmed, medicines (antibiotics) that kill germs are usually prescribed. In more severe cases, antibiotics may be given through an intravenous (IV) access. HOME CARE INSTRUCTIONS   Always remove ticks with tweezers. Do not use petroleum jelly or other methods to kill or remove the tick. Slide the tweezers under the body and pull out as much as you can. If you are not sure what it is,   save it in a jar and show your caregiver.   Once you remove the tick, the skin will heal on its own. Wash your hands and the affected area with water and soap. You may place a bandage on the affected area.   Take medicine as directed. You may be advised to take a full course of antibiotics.   Follow up with your caregiver as recommended.  FINDING OUT THE RESULTS OF YOUR TEST Not all test results are available during your visit. If your test results are not back during the visit, make an appointment with your caregiver to find out the results. Do not assume everything is normal if you have not heard from your caregiver or the medical facility. It is important for you to follow up on all of your test results. PROGNOSIS  If Lyme disease is confirmed, early treatment with antibiotics is very effective. Following preventive guidelines is important since it is possible to get the disease more than once. PREVENTION   Wear long sleeves and long pants in wooded or grassy areas. Tuck your pants into your socks.   Use an insect repellent while hiking.   Check yourself, your children, and your pets regularly for ticks after playing outside.   Clear piles of leaves or brush from your yard. Ticks might live there.  SEEK MEDICAL CARE IF:   You or your child has an oral temperature above 102 F (38.9 C).   You develop a severe headache following the bite.   You feel generally ill.    You notice a rash.   You are having trouble removing the tick.   The bite area has red skin or yellow drainage.  SEEK IMMEDIATE MEDICAL CARE IF:   Your face is weak and droopy or you have other neurological symptoms.   You have severe joint pain or weakness.  MAKE SURE YOU:   Understand these instructions.   Will watch your condition.   Will get help right away if you are not doing well or get worse.  FOR MORE INFORMATION Centers for Disease Control and Prevention: www.cdc.gov American Academy [employer]: www.aafp.org Document Released: 12/25/2009 Document Revised: 09/19/2011 Document Reviewed: 12/25/2009 [employer] Patient Information 2012 [employer]. 

## 2012-03-16 NOTE — Progress Notes (Signed)
Is a 51 year old man who found a tick on his left chest today's ago. He's able to remove it but he's starting to have redness and ulceration just superior to the left areola. He's had no fever no other rash, no headache and no stiff neck.  Objective: 3 mm ulcerated area superior to the left areola without induration, no other rash or  Patient's alert and in no distress. No other rashes identified  Assessment: Uncomplicated tick bite  Plan doxycycline 100 mg twice a day x10 days. Patient Instructions  Wood Tick Bite Ticks are insects that attach themselves to the skin. Most tick bites are harmless, but sometimes ticks carry diseases that can make a person quite ill. The chance of getting ill depends on:  The kind of tick that bites you.   Time of year.   How long the tick is attached.   Geographic location.  Wood ticks are also called dog ticks. They are generally black. They can have white markings. They live in shrubs and grassy areas. They are larger than deer ticks. Wood ticks are about the size of a watermelon seed. They have a hard body. The most common places for ticks to attach themselves are the scalp, neck, armpits, waist, and groin. Wood ticks may stay attached for up to 2 weeks. TICKS MUST BE REMOVED AS SOON AS POSSIBLE TO HELP PREVENT DISEASES CAUSED BY TICK BITES.  TO REMOVE A TICK: 1. If available, put on latex gloves before trying to remove a tick.  2. Grasp the tick as close to the skin as possible, with curved forceps, fine tweezers or a special tick removal tool.  3. Pull gently with steady pressure until the tick lets go. Do not twist the tick or jerk it suddenly. This may break off the tick's head or mouth parts.  4. Do not crush the tick's body. This could force disease-carrying fluids from the tick into your body.  5. After the tick is removed, wash the bite area and your hands with soap and water or other disinfectant.  6. Apply a small amount of antiseptic cream  or ointment to the bite site.  7. Wash and disinfect any instruments that were used.  8. Save the tick in a jar or plastic bag for later identification. Preserve the tick with a bit of alcohol or put it in the freezer.  9. Do not apply a hot match, petroleum jelly, or fingernail polish to the tick. This does not work and may increase the chances of disease from the tick bite.  YOU MAY NEED TO SEE YOUR CAREGIVER FOR A TETANUS SHOT NOW IF:  You have no idea when you had the last one.   You have never had a tetanus shot before.  If you need a tetanus shot, and you decide not to get one, there is a rare chance of getting tetanus. Sickness from tetanus can be serious. If you get a tetanus shot, your arm may swell, get red and warm to the touch at the shot site. This is common and not a problem. PREVENTION  Wear protective clothing. Long sleeves and pants are best.   Wear white clothes to see ticks more easily   Tuck your pant legs into your socks.   If walking on trail, stay in the middle of the trail to avoid brushing against bushes.   Put insect repellent on all exposed skin and along boot tops, pant legs and sleeve cuffs   Check clothing, hair  and skin repeatedly and before coming inside.   Brush off any ticks that are not attached.  SEEK MEDICAL CARE IF:   You cannot remove a tick or part of the tick that is left in the skin.   Unexplained fever.   Redness and swelling in the area of the tick bite.   Tender, swollen lymph glands.   Diarrhea.   Weight loss.   Cough.   Fatigue.   Muscle, joint or bone pain.   Belly pain.   Headache.   Rash.  SEEK IMMEDIATE MEDICAL CARE IF:   You develop an oral temperature above 102 F (38.9 C).   You are having trouble walking or moving your legs.   Numbness in the legs.   Shortness of breath.   Confusion.   Repeated vomiting.  Document Released: 09/27/2000 Document Revised: 09/19/2011 Document Reviewed:  09/05/2008 [facility] Patient Information 2012 [place], [place].Deer Tick Bite Deer ticks are brown arachnids (spider family) that vary in size from as small as the head of a pin to 1/4 inch (1/2 cm) diameter. They thrive in wooded areas. Deer are the preferred host of adult deer ticks. Small rodents are the host of young ticks (nymphs). When a person walks in a field or wooded area, young and adult ticks in the surrounding grass and vegetation can attach themselves to the skin. They can suck blood for hours to days if unnoticed. Ticks are found all over the [place] Some ticks carry a specific bacteria (Borrelia burgdorferi) that causes an infection called Lyme disease. The bacteria is typically passed into a person during the blood sucking process. This happens after the tick has been attached for at least a number of hours. While ticks can be found all over the [place] those carrying the bacteria that causes Lyme disease are most common in [place] and the [place]. Only a small proportion of ticks in these areas carry the Lyme disease bacteria and cause human infections. Ticks usually attach to warm spots on the body, such as the:  Head.   Back.   Neck.   Armpits.   Groin.  SYMPTOMS  Most of the time, a deer tick bite will not be felt. You may or may not see the attached tick. You may notice mild irritation or redness around the bite site. If the deer tick passes the Lyme disease bacteria to a person, a round, red rash may be noticed 2 to 3 days after the bite. The rash may be clear in the middle, like a bull's-eye or target. If not treated, other symptoms may develop several days to weeks after the onset of the rash. These symptoms may include:  New rash lesions.   Fatigue and weakness.   General ill feeling and achiness.   Chills.   Headache and neck pain.   Swollen lymph glands.   Sore muscles and joints.  5 to 15% of untreated people with Lyme disease may develop more severe  illnesses after several weeks to months. This may include inflammation of the brain lining (meningitis), nerve palsies, an abnormal heartbeat, or severe muscle and joint pain and inflammation (myositis or arthritis). DIAGNOSIS   Physical exam and medical history.   Viewing the tick if it was saved for confirmation.   Blood tests (to check or confirm the presence of Lyme disease).  TREATMENT  Most ticks do not carry disease. If found, an attached tick should be removed using tweezers. Tweezers should be placed under the body of the  tick so it is removed by its attachment parts (pincers). If there are signs or symptoms of being sick, or Lyme disease is confirmed, medicines (antibiotics) that kill germs are usually prescribed. In more severe cases, antibiotics may be given through an intravenous (IV) access. HOME CARE INSTRUCTIONS   Always remove ticks with tweezers. Do not use petroleum jelly or other methods to kill or remove the tick. Slide the tweezers under the body and pull out as much as you can. If you are not sure what it is, save it in a jar and show your caregiver.   Once you remove the tick, the skin will heal on its own. Wash your hands and the affected area with water and soap. You may place a bandage on the affected area.   Take medicine as directed. You may be advised to take a full course of antibiotics.   Follow up with your caregiver as recommended.  FINDING OUT THE RESULTS OF YOUR TEST Not all test results are available during your visit. If your test results are not back during the visit, make an appointment with your caregiver to find out the results. Do not assume everything is normal if you have not heard from your caregiver or the medical facility. It is important for you to follow up on all of your test results. PROGNOSIS  If Lyme disease is confirmed, early treatment with antibiotics is very effective. Following preventive guidelines is important since it is possible to  get the disease more than once. PREVENTION   Wear long sleeves and long pants in wooded or grassy areas. Tuck your pants into your socks.   Use an insect repellent while hiking.   Check yourself, your children, and your pets regularly for ticks after playing outside.   Clear piles of leaves or brush from your yard. Ticks might live there.  SEEK MEDICAL CARE IF:   You or your child has an oral temperature above 102 F (38.9 C).   You develop a severe headache following the bite.   You feel generally ill.   You notice a rash.   You are having trouble removing the tick.   The bite area has red skin or yellow drainage.  SEEK IMMEDIATE MEDICAL CARE IF:   Your face is weak and droopy or you have other neurological symptoms.   You have severe joint pain or weakness.  MAKE SURE YOU:   Understand these instructions.   Will watch your condition.   Will get help right away if you are not doing well or get worse.  FOR MORE INFORMATION Centers for Disease Control and Prevention: FootballExhibition.com.br American Academy [employer]: www.https://powers.com/ Document Released: 12/25/2009 Document Revised: 09/19/2011 Document Reviewed: 12/25/2009 [facility] Patient Information 2012 [place], [place].

## 2012-06-11 ENCOUNTER — Ambulatory Visit (INDEPENDENT_AMBULATORY_CARE_PROVIDER_SITE_OTHER): Payer: BC Managed Care – PPO | Admitting: Family Medicine

## 2012-06-11 ENCOUNTER — Encounter: Payer: Self-pay | Admitting: Family Medicine

## 2012-06-11 VITALS — BP 134/90 | HR 74 | Temp 98.0°F | Resp 16 | Ht 69.0 in | Wt 212.8 lb

## 2012-06-11 DIAGNOSIS — F329 Major depressive disorder, single episode, unspecified: Secondary | ICD-10-CM

## 2012-06-11 DIAGNOSIS — M48061 Spinal stenosis, lumbar region without neurogenic claudication: Secondary | ICD-10-CM | POA: Insufficient documentation

## 2012-06-11 DIAGNOSIS — Z760 Encounter for issue of repeat prescription: Secondary | ICD-10-CM

## 2012-06-11 DIAGNOSIS — E785 Hyperlipidemia, unspecified: Secondary | ICD-10-CM

## 2012-06-11 DIAGNOSIS — F32A Depression, unspecified: Secondary | ICD-10-CM

## 2012-06-11 MED ORDER — OMEPRAZOLE 20 MG PO CPDR: 20.0000 mg | DELAYED_RELEASE_CAPSULE | Freq: Every day | ORAL | Status: DC

## 2012-06-11 MED ORDER — BUPROPION HCL ER (XL) 300 MG PO TB24: 300.0000 mg | ORAL_TABLET | ORAL | Status: DC

## 2012-06-11 MED ORDER — NIACIN ER (ANTIHYPERLIPIDEMIC) 500 MG PO TBCR: 500.0000 mg | EXTENDED_RELEASE_TABLET | Freq: Every day | ORAL | Status: DC

## 2012-06-11 NOTE — Progress Notes (Signed)
S: This 51 y.o. Cauc male has dyslipidemia and chronic depression. He takes Niaspan and has no problems with flushing; he premedicates with ASA 325 mg. Bupropion is effective for depression, restless legs and has helped him remain    tobacco- free.  He denies any cardiovascular symptoms such as CP or tightness, SOB, cough, palpitations, edema, dizziness, HA, numbness, syncope or weakness. GERD symptoms ar controlled with omeprazole.  ROS: As per HPI  O: Filed Vitals:   06/11/12 1517  BP: 134/90                                                   Weight up 3+ lbs since Feb 2013  Pulse: 74  Temp: 98 F (36.7 C)  Resp: 16   GEN: In NAD; WN,WD HENT: [place]/AT; EOMI, conj/sclerae clear COR: RRR LUNGS: Normal resp rate and effort NEURO: A&O x 3; CNs grossly intact; gait- foot drop evident; otherwise nonfocal  A/P: 1. Dyslipidemia - RF: Niaspan 500 mg CR   #90   3RFs  2. Depression - RF: Bupropion 300 mg 24 hr tab  #90   3RFs  3. Medication refill  RF: Omeprazole 20 mg   #90   3RFs

## 2012-06-24 ENCOUNTER — Ambulatory Visit (HOSPITAL_BASED_OUTPATIENT_CLINIC_OR_DEPARTMENT_OTHER): Payer: BC Managed Care – PPO | Admitting: Internal Medicine

## 2012-06-24 ENCOUNTER — Other Ambulatory Visit (HOSPITAL_BASED_OUTPATIENT_CLINIC_OR_DEPARTMENT_OTHER): Payer: BC Managed Care – PPO | Admitting: Lab

## 2012-06-24 VITALS — BP 154/90 | HR 81 | Temp 99.2°F | Resp 18 | Ht 69.0 in | Wt 210.9 lb

## 2012-06-24 DIAGNOSIS — D45 Polycythemia vera: Secondary | ICD-10-CM

## 2012-06-24 DIAGNOSIS — D751 Secondary polycythemia: Secondary | ICD-10-CM

## 2012-06-24 LAB — CBC WITH DIFFERENTIAL/PLATELET
BASO%: 0.3 % (ref 0.0–2.0)
Basophils Absolute: 0 10*3/uL (ref 0.0–0.1)
EOS%: 1.9 % (ref 0.0–7.0)
Eosinophils Absolute: 0.1 10*3/uL (ref 0.0–0.5)
HCT: 47.5 % (ref 38.4–49.9)
HGB: 16.5 g/dL (ref 13.0–17.1)
LYMPH%: 35.7 % (ref 14.0–49.0)
MCH: 32.2 pg (ref 27.2–33.4)
MCHC: 34.7 g/dL (ref 32.0–36.0)
MCV: 92.8 fL (ref 79.3–98.0)
MONO#: 0.3 10*3/uL (ref 0.1–0.9)
MONO%: 4.3 % (ref 0.0–14.0)
NEUT#: 4.2 10*3/uL (ref 1.5–6.5)
NEUT%: 57.8 % (ref 39.0–75.0)
Platelets: 143 10*3/uL (ref 140–400)
RBC: 5.12 10*6/uL (ref 4.20–5.82)
RDW: 14.9 % — ABNORMAL HIGH (ref 11.0–14.6)
WBC: 7.3 10*3/uL (ref 4.0–10.3)
lymph#: 2.6 10*3/uL (ref 0.9–3.3)

## 2012-06-24 LAB — COMPREHENSIVE METABOLIC PANEL (CC13)
ALT: 13 U/L (ref 0–55)
AST: 11 U/L (ref 5–34)
Albumin: 3.9 g/dL (ref 3.5–5.0)
Alkaline Phosphatase: 100 U/L (ref 40–150)
BUN: 10 mg/dL (ref 7.0–26.0)
CO2: 27 mEq/L (ref 22–29)
Calcium: 9.3 mg/dL (ref 8.4–10.4)
Chloride: 104 mEq/L (ref 98–107)
Creatinine: 1.1 mg/dL (ref 0.7–1.3)
Glucose: 91 mg/dl (ref 70–99)
Potassium: 4.2 mEq/L (ref 3.5–5.1)
Sodium: 140 mEq/L (ref 136–145)
Total Bilirubin: 0.8 mg/dL (ref 0.20–1.20)
Total Protein: 6.7 g/dL (ref 6.4–8.3)
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
## 2012-06-24 NOTE — Patient Instructions (Signed)
Your hemoglobin and hematocrit are within the acceptable range today. No need for phlebotomy today. Followup in 6 months with repeat CBC

## 2012-07-07 ENCOUNTER — Telehealth: Payer: Self-pay | Admitting: Internal Medicine

## 2012-07-07 NOTE — Telephone Encounter (Signed)
s.w pt's e.c. advised her of his March 2014 appt....sed

## 2012-12-01 ENCOUNTER — Ambulatory Visit (INDEPENDENT_AMBULATORY_CARE_PROVIDER_SITE_OTHER): Payer: BC Managed Care – PPO | Admitting: Family Medicine

## 2012-12-01 ENCOUNTER — Encounter: Payer: Self-pay | Admitting: Family Medicine

## 2012-12-01 VITALS — BP 139/88 | HR 76 | Temp 97.5°F | Resp 18 | Ht 69.0 in | Wt 207.0 lb

## 2012-12-01 DIAGNOSIS — K5900 Constipation, unspecified: Secondary | ICD-10-CM

## 2012-12-01 DIAGNOSIS — Z0000 Encounter for general adult medical examination without abnormal findings: Secondary | ICD-10-CM

## 2012-12-01 DIAGNOSIS — K439 Ventral hernia without obstruction or gangrene: Secondary | ICD-10-CM

## 2012-12-01 DIAGNOSIS — Z86010 Personal history of colonic polyps: Secondary | ICD-10-CM

## 2012-12-01 DIAGNOSIS — E785 Hyperlipidemia, unspecified: Secondary | ICD-10-CM

## 2012-12-01 DIAGNOSIS — K5909 Other constipation: Secondary | ICD-10-CM

## 2012-12-01 LAB — POCT URINALYSIS DIPSTICK
Bilirubin, UA: NEGATIVE
Blood, UA: NEGATIVE
Glucose, UA: NEGATIVE
Ketones, UA: NEGATIVE
Leukocytes, UA: NEGATIVE
Nitrite, UA: NEGATIVE
Protein, UA: NEGATIVE
Spec Grav, UA: 1.025
Urobilinogen, UA: 1
pH, UA: 6

## 2012-12-01 LAB — IFOBT (OCCULT BLOOD): IFOBT: NEGATIVE

## 2012-12-01 LAB — T3, FREE: T3, Free: 3.6 pg/mL (ref 2.3–4.2)

## 2012-12-01 LAB — LIPID PANEL
Cholesterol: 173 mg/dL (ref 0–200)
HDL: 38 mg/dL — ABNORMAL LOW (ref >39)
LDL Cholesterol: 111 mg/dL — ABNORMAL HIGH (ref 0–99)
Total CHOL/HDL Ratio: 4.6 Ratio
Triglycerides: 119 mg/dL (ref <150)
VLDL: 24 mg/dL (ref 0–40)

## 2012-12-01 LAB — TSH: TSH: 1.498 u[IU]/mL (ref 0.350–4.500)
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
## 2012-12-01 NOTE — Patient Instructions (Addendum)
Keeping you healthy  Get these tests  Blood pressure- Have your blood pressure checked once a year by your healthcare provider.  Normal blood pressure is 120/80  Weight- Have your body mass index (BMI) calculated to screen for obesity.  BMI is a measure of body fat based on height and weight. You can also calculate your own BMI at [email or web address].  Cholesterol- Have your cholesterol checked every year.  Diabetes- Have your blood sugar checked regularly if you have high blood pressure, high cholesterol, have a family history of diabetes or if you are overweight.  Screening for Colon Cancer- Colonoscopy starting at age 61.  Screening may begin sooner depending on your family history and other health conditions. Follow up colonoscopy as directed by your Gastroenterologist.  Screening for Prostate Cancer- Both blood work (PSA) and a rectal exam help screen for Prostate Cancer.  Screening begins at age 7 with African-American men and at age 61 with Caucasian men.  Screening may begin sooner depending on your family history.  Take these medicines  Aspirin- One aspirin daily can help prevent Heart disease and Stroke.  Flu shot- Every fall.  Tetanus- Every 10 years. Las Tdap was done in 2012; next Tetanus due in 2022.  Zostavax- Once after the age of 61 to prevent Shingles.  Pneumonia shot- Once after the age of 75; if you are younger than 77, ask your healthcare provider if you need a Pneumonia shot.  Take these steps  Don't smoke- If you do smoke, talk to your doctor about quitting.  For tips on how to quit, go to www.smokefree.gov or call 1-800-QUIT-NOW.  Be physically active- Exercise 5 days a week for at least 30 minutes.  If you are not already physically active start slow and gradually work up to 30 minutes of moderate physical activity.  Examples of moderate activity include walking briskly, mowing the yard, dancing, swimming, bicycling, etc.  Eat a healthy diet- Eat a  variety of healthy food such as fruits, vegetables, low fat milk, low fat cheese, yogurt, lean meant, poultry, fish, beans, tofu, etc. For more information go to www.thenutritionsource.org  Drink alcohol in moderation- Limit alcohol intake to less than two drinks a day. Never drink and drive.  Dentist- Brush and floss twice daily; visit your dentist twice a year.  Depression- Your emotional health is as important as your physical health. If you're feeling down, or losing interest in things you would normally enjoy please talk to your healthcare provider.  Eye exam- Visit your eye doctor every year.  Safe sex- If you may be exposed to a sexually transmitted infection, use a condom.  Seat belts- Seat belts can save your life; always wear one.  Smoke/Carbon Monoxide detectors- These detectors need to be installed on the appropriate level of your home.  Replace batteries at least once a year.  Skin cancer- When out in the sun, cover up and use sunscreen 15 SPF or higher.  Violence- If anyone is threatening you, please tell your healthcare provider.  Living Will/ Health care power of attorney- Speak with your healthcare provider and family.

## 2012-12-02 LAB — VITAMIN D 25 HYDROXY (VIT D DEFICIENCY, FRACTURES): Vit D, 25-Hydroxy: 15 ng/mL — ABNORMAL LOW (ref 30–89)

## 2012-12-02 LAB — PSA: PSA: 1.97 ng/mL (ref <=4.00)

## 2012-12-03 ENCOUNTER — Other Ambulatory Visit: Payer: Self-pay | Admitting: Family Medicine

## 2012-12-03 MED ORDER — ERGOCALCIFEROL 1.25 MG (50000 UT) PO CAPS: 50000.0000 [IU] | ORAL_CAPSULE | ORAL | Status: DC

## 2012-12-03 NOTE — Progress Notes (Signed)
Quick Note:  Please call pt and advise that the following labs are abnormal... Vitamin D level is very low; I am prescribing Vit D supplement 40981 units to be taken once a week. Increase Vitamin D in diet by eating more salmon, tuna and like fish, mushrooms and some dairy products (yogurt, some cheeses, etc.) Need to get some sun exposure most days of the week.  All other labs look good. Thyroid tests are normal. Prostate blood test (PSA) is normal. Lipids are okay except HDL ("good") cholesterol is too low. Need to continue current medications.  Copy to pt. ______

## 2012-12-08 ENCOUNTER — Ambulatory Visit (INDEPENDENT_AMBULATORY_CARE_PROVIDER_SITE_OTHER): Payer: BC Managed Care – PPO | Admitting: Internal Medicine

## 2012-12-08 VITALS — BP 138/80 | HR 111 | Temp 98.0°F | Resp 18 | Ht 69.0 in | Wt 207.0 lb

## 2012-12-08 DIAGNOSIS — J45909 Unspecified asthma, uncomplicated: Secondary | ICD-10-CM

## 2012-12-08 MED ORDER — AZITHROMYCIN 500 MG PO TABS: 500.0000 mg | ORAL_TABLET | Freq: Every day | ORAL | Status: DC

## 2012-12-08 MED ORDER — METHYLPREDNISOLONE ACETATE 80 MG/ML IJ SUSP
120.0000 mg | Freq: Once | INTRAMUSCULAR | Status: AC
  Administered 2012-12-08: 120 mg via INTRAMUSCULAR

## 2012-12-08 MED ORDER — BENZONATATE 200 MG PO CAPS: 200.0000 mg | ORAL_CAPSULE | Freq: Two times a day (BID) | ORAL | Status: DC | PRN

## 2012-12-08 NOTE — Patient Instructions (Signed)
Chronic Asthmatic Bronchitis Chronic asthmatic bronchitis is often a complication of frequent asthma and/or bronchitis. After a long enough period of time, the continual airflow blockage is present in spite of treatment for asthma. The medications that used to treat asthma no longer work. The symptoms of chronic bronchitis may also be present. Bronchitis is an inflammation of the breathing tubules in the lungs. The combination of asthma, chronic bronchitis, and emphysema all affect the small breathing tubules (bronchial tree) in our lungs. It is a common condition. The problems from each are similar and overlap with each other so are sometimes hard to diagnose. When the asthma and bronchitis are combined, there is usually inflammation and infection. The small bronchial tubes produce more mucus. This blocks the airways and makes breathing harder. Usually this process is caused more by external irritants than infection. Smokers with chronic bronchitis are at a greater risk to develop asthmatic bronchitis. CAUSES   Why some people with asthma go on to develop chronic asthmatic bronchitis is not known. Smoking and environmental toxins or allergens seem to play a role. There are wide differences in who is susceptible.  Abnormalities of the small airways may develop in persons with persistent asthma. Asthmatics can be uncommonly subject to the effects of smoking. Asthma is also found associated with a number of other diseases. SYMPTOMS  Asthma, chronic bronchitis, and emphysema all cause symptoms of cough, wheezing, shortness of breath, and recurring infections. There may also be chest discomfort. All of the above symptoms happen more often in chronic asthmatic bronchitis. DIAGNOSIS   Asthma, chronic bronchitis, and emphysema all affect the entire bronchial tree. This makes it difficult on exam to tell them apart. Other tests of the lungs are done to prove a diagnosis. These are called pulmonary function  tests. TREATMENT   The asthmatic condition itself must always be treated.  Infection can be treated with antibiotics (medications to kill germs).  Serious infections may require hospitalization. These can include pneumonia, sinus infections, and acute bronchitis. HOME CARE INSTRUCTIONS  Use prescription medications as ordered by your caregiver.  Avoid pollen, dust, animal dander, molds, smoke, and other things that cause attacks at home and at work.  You may have fewer attacks if you decrease dust in your home. Electrostatic air cleaners may help.  It may help to replace your pillows or mattress with materials less likely to cause allergies.  If you are not on fluid restriction, drink 8 to 10 glasses of water each day.  Discuss possible exercise routines with your caregiver.  If animal dander is the cause of asthma, you may need to get rid of pets.  It is important that you:  Become educated about your medical condition.  Participate in maintaining wellness.  Seek medical care promptly or immediately as indicated below.  Delay in seeking medical attention could cause permanent injury and may be a risk to your life. SEEK MEDICAL CARE IF  You have wheezing and shortness of breath even if taking medicine to prevent attacks.  An oral temperature above 102 F (38.9 C)  You have muscle aches, chest pain, or thickening of sputum.  Your sputum changes from clear or white to yellow, green, gray, or bloody.  You have any problems that may be related to the medicine you are taking (such as a rash, itching, swelling, or trouble breathing). SEEK IMMEDIATE MEDICAL CARE IF:  Your usual medicines do not stop your wheezing.  There is increased coughing and/or shortness of breath.  You   have increased difficulty breathing. MAKE SURE YOU:   Understand these instructions.  Will watch your condition.  Will get help right away if you are not doing well or get worse. Document  Released: 07/18/2006 Document Revised: 12/23/2011 Document Reviewed: 09/15/2007 ExitCare Patient Information 2013 [employer].  

## 2012-12-08 NOTE — Progress Notes (Signed)
  Subjective:    Patient ID: Ralph [name], male    DOB: 08-04-1961, 52 y.o.   MRN: [id number]  HPI Former smoker, co cough, dry, low grade fever, fatigue. No sob, cp. Had cpe last week here and all ok.   Review of Systems     Objective:   Physical Exam  Vitals reviewed. Constitutional: He is oriented to person, place, and time. He appears well-nourished. No distress.  HENT:  Right Ear: External ear normal.  Left Ear: External ear normal.  Nose: Rhinorrhea present. Right sinus exhibits no maxillary sinus tenderness and no frontal sinus tenderness. Left sinus exhibits no maxillary sinus tenderness and no frontal sinus tenderness.  Mouth/Throat: Oropharynx is clear and moist.  Eyes: EOM are normal.  Cardiovascular: Regular rhythm and normal heart sounds.  Tachycardia present.   Pulmonary/Chest: Effort normal. He has wheezes.  Neurological: He is alert and oriented to person, place, and time. Gait abnormal. Coordination normal.  Skin: No rash noted.  Psychiatric: He has a normal mood and affect.          Assessment & Plan:  Asthmatic bronchitis Depomedrol/Zithromax 500mg Radene Ou

## 2012-12-22 ENCOUNTER — Ambulatory Visit (HOSPITAL_BASED_OUTPATIENT_CLINIC_OR_DEPARTMENT_OTHER): Payer: BC Managed Care – PPO | Admitting: Internal Medicine

## 2012-12-22 ENCOUNTER — Encounter: Payer: Self-pay | Admitting: Internal Medicine

## 2012-12-22 ENCOUNTER — Other Ambulatory Visit (HOSPITAL_BASED_OUTPATIENT_CLINIC_OR_DEPARTMENT_OTHER): Payer: BC Managed Care – PPO

## 2012-12-22 VITALS — BP 135/87 | HR 86 | Temp 97.6°F | Resp 18 | Ht 69.0 in | Wt 208.5 lb

## 2012-12-22 DIAGNOSIS — D45 Polycythemia vera: Secondary | ICD-10-CM

## 2012-12-22 DIAGNOSIS — D751 Secondary polycythemia: Secondary | ICD-10-CM

## 2012-12-22 LAB — CBC WITH DIFFERENTIAL/PLATELET
BASO%: 0.8 % (ref 0.0–2.0)
Basophils Absolute: 0.1 10*3/uL (ref 0.0–0.1)
EOS%: 0.7 % (ref 0.0–7.0)
Eosinophils Absolute: 0.1 10*3/uL (ref 0.0–0.5)
HCT: 49 % (ref 38.4–49.9)
HGB: 16.9 g/dL (ref 13.0–17.1)
LYMPH%: 27 % (ref 14.0–49.0)
MCH: 32.1 pg (ref 27.2–33.4)
MCHC: 34.4 g/dL (ref 32.0–36.0)
MCV: 93.3 fL (ref 79.3–98.0)
MONO#: 0.7 10*3/uL (ref 0.1–0.9)
MONO%: 5.5 % (ref 0.0–14.0)
NEUT#: 8.4 10*3/uL — ABNORMAL HIGH (ref 1.5–6.5)
NEUT%: 66 % (ref 39.0–75.0)
Platelets: 196 10*3/uL (ref 140–400)
RBC: 5.25 10*6/uL (ref 4.20–5.82)
RDW: 14.5 % (ref 11.0–14.6)
WBC: 12.8 10*3/uL — ABNORMAL HIGH (ref 4.0–10.3)
lymph#: 3.5 10*3/uL — ABNORMAL HIGH (ref 0.9–3.3)

## 2012-12-22 NOTE — Patient Instructions (Signed)
Your CBC showed a stable hemoglobin and hematocrit. Followup with your primary care physician. I would see an as-needed basis at this point.
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
## 2013-04-27 ENCOUNTER — Other Ambulatory Visit: Payer: Self-pay

## 2013-04-27 MED ORDER — NIACIN ER (ANTIHYPERLIPIDEMIC) 500 MG PO TBCR: 500.0000 mg | EXTENDED_RELEASE_TABLET | Freq: Every day | ORAL | Status: DC

## 2013-04-27 MED ORDER — BUPROPION HCL ER (XL) 300 MG PO TB24: 300.0000 mg | ORAL_TABLET | ORAL | Status: DC

## 2013-04-27 MED ORDER — OMEPRAZOLE 20 MG PO CPDR: 20.0000 mg | DELAYED_RELEASE_CAPSULE | Freq: Every day | ORAL | Status: DC

## 2013-05-04 ENCOUNTER — Ambulatory Visit

## 2013-05-04 ENCOUNTER — Ambulatory Visit (INDEPENDENT_AMBULATORY_CARE_PROVIDER_SITE_OTHER): Admitting: Family Medicine

## 2013-05-04 VITALS — BP 138/90 | HR 88 | Temp 97.9°F | Resp 16 | Ht 69.0 in | Wt 209.0 lb

## 2013-05-04 DIAGNOSIS — M25561 Pain in right knee: Secondary | ICD-10-CM

## 2013-05-04 DIAGNOSIS — M48061 Spinal stenosis, lumbar region without neurogenic claudication: Secondary | ICD-10-CM

## 2013-05-04 DIAGNOSIS — M25569 Pain in unspecified knee: Secondary | ICD-10-CM

## 2013-05-04 MED ORDER — METHOCARBAMOL 500 MG PO TABS: 500.0000 mg | ORAL_TABLET | Freq: Four times a day (QID) | ORAL | Status: DC

## 2013-05-04 MED ORDER — PREDNISONE 20 MG PO TABS: ORAL_TABLET | ORAL | Status: DC
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
## 2013-05-04 NOTE — Patient Instructions (Addendum)
We are going to treat you for spinal stenosis and possible nerve irritation with prednisone and the muscle relaxer robaxin. Please go ahead and get back in touch with your neurosurgeon  Dr. [facility] (Formerly [facility]) [address] [place], [place] [address] Phone: [phone]

## 2013-05-29 ENCOUNTER — Other Ambulatory Visit: Payer: Self-pay | Admitting: Family Medicine

## 2013-07-22 ENCOUNTER — Ambulatory Visit (INDEPENDENT_AMBULATORY_CARE_PROVIDER_SITE_OTHER): Admitting: Family Medicine

## 2013-07-22 ENCOUNTER — Encounter: Payer: Self-pay | Admitting: Family Medicine

## 2013-07-22 VITALS — BP 136/88 | HR 75 | Temp 98.0°F | Resp 16 | Ht 70.5 in | Wt 211.0 lb

## 2013-07-22 DIAGNOSIS — F32A Depression, unspecified: Secondary | ICD-10-CM

## 2013-07-22 DIAGNOSIS — F329 Major depressive disorder, single episode, unspecified: Secondary | ICD-10-CM

## 2013-07-22 DIAGNOSIS — M48061 Spinal stenosis, lumbar region without neurogenic claudication: Secondary | ICD-10-CM

## 2013-07-22 DIAGNOSIS — E785 Hyperlipidemia, unspecified: Secondary | ICD-10-CM

## 2013-07-22 MED ORDER — NIACIN ER (ANTIHYPERLIPIDEMIC) 500 MG PO TBCR: 500.0000 mg | EXTENDED_RELEASE_TABLET | Freq: Every day | ORAL | Status: DC

## 2013-07-22 MED ORDER — BUPROPION HCL ER (XL) 300 MG PO TB24: 300.0000 mg | ORAL_TABLET | ORAL | Status: DC

## 2013-07-22 MED ORDER — OMEPRAZOLE 20 MG PO CPDR: 20.0000 mg | DELAYED_RELEASE_CAPSULE | Freq: Every day | ORAL | Status: DC

## 2013-07-23 ENCOUNTER — Telehealth: Payer: Self-pay | Admitting: Family Medicine

## 2013-07-23 NOTE — Progress Notes (Signed)
S:  This 52 y.o. Cauc male is here for med refills. Pt has chronic depression controlled on Bupropion 300 mg daily. He has ongoing sleep disturbance related to chronic LBP; he has lumbar DDD, treated in past by Dr. Franky Macho. Pt last seen about 2 years ago and recalls maybe receiving an injection (minimal pain relief). Symptoms include LBP w/ paresthesias; pt denies weakness or muscle atrophy. He is interested in returning for re-evaluation and treatment plan.  Pt has dyslipidemia w/ low HDL and average CHD risk= 4.6. He denies any problems with niacin. Diet modifications have been modest at best. Limited exercise - work is sedentary.  Patient Active Problem List   Diagnosis Date Noted  . Lumbar spinal stenosis 06/11/2012  . Polycythemia 11/10/2011  . Dyslipidemia 11/10/2011  . Depression 11/10/2011  . Right foot drop 11/10/2011  . GERD (gastroesophageal reflux disease) 11/10/2011  . Constipation, chronic 11/10/2011  . RLS (restless legs syndrome) 11/10/2011   PMHx, Soc Hx and Fam Hx reviewed. Medications reconciled.  ROS: As per HPI.  O: Filed Vitals:   07/22/13 1149  BP: 136/88  Pulse: 75  Temp: 98 F (36.7 C)  Resp: 16   GEN: In NAD: WN,WD. HENT: [place]/AT; EOMI w/ clear conj/sclerae. EAC/nose/oroph unremarkable. COR: RRR. LUNGS: Normal resp rate and effort. SKIN: W&D; intact w/o erythema, rashes, jaundice or pallor. BACK: Decreased ROM w/ stiffness and gait abnormality. NEURO: A&O x 3; CNs intact. Nonfocal. Antalgic gait w/o assistive device.  A/P: Lumbar spinal stenosis - Plan: Ambulatory referral to Neurosurgery  Dyslipidemia- Feb 2014 lipid panel: TChol= 173, TGs= 119, HDL= 38, LDL= 111; CHD risk= 4.6. Continue Niacin; Mediterranean Diet guidelines.  Depression- Continue Bupropion 300 mg 24 hr tablets  1 tab daily.  Meds ordered this encounter  Medications  . buPROPion (WELLBUTRIN XL) 300 MG 24 hr tablet    Sig: Take 1 tablet (300 mg total) by mouth every morning.   Dispense:  90 tablet    Refill:  3  . niacin (NIASPAN) 500 MG CR tablet    Sig: Take 1 tablet (500 mg total) by mouth at bedtime.    Dispense:  90 tablet    Refill:  3  . omeprazole (PRILOSEC) 20 MG capsule    Sig: Take 1 capsule (20 mg total) by mouth daily.    Dispense:  90 capsule    Refill:  3

## 2013-07-23 NOTE — Telephone Encounter (Signed)
Faxed Rx to [facility]. Wellbutrin, Omeprazole, Niaspan

## 2013-09-21 ENCOUNTER — Ambulatory Visit (INDEPENDENT_AMBULATORY_CARE_PROVIDER_SITE_OTHER): Admitting: Emergency Medicine

## 2013-09-21 ENCOUNTER — Ambulatory Visit

## 2013-09-21 VITALS — BP 140/88 | HR 78 | Temp 98.4°F | Resp 18 | Wt 213.0 lb

## 2013-09-21 DIAGNOSIS — S20211A Contusion of right front wall of thorax, initial encounter: Secondary | ICD-10-CM

## 2013-09-21 DIAGNOSIS — R079 Chest pain, unspecified: Secondary | ICD-10-CM

## 2013-09-21 DIAGNOSIS — S20219A Contusion of unspecified front wall of thorax, initial encounter: Secondary | ICD-10-CM

## 2013-09-21 MED ORDER — ACETAMINOPHEN-CODEINE #3 300-30 MG PO TABS: 1.0000 | ORAL_TABLET | ORAL | Status: DC | PRN

## 2013-09-21 NOTE — Patient Instructions (Signed)
Rib Fracture  A rib fracture is a break or crack in one of the bones of the ribs. The ribs are a group of long, curved bones that wrap around your chest and attach to your spine. They protect your lungs and other organs in the chest cavity. A broken or cracked rib is often painful, but most do not cause other problems. Most rib fractures heal on their own over time. However, rib fractures can be more serious if multiple ribs are broken or if broken ribs move out of place and push against other structures.  CAUSES   · A direct blow to the chest. For example, this could happen during contact sports, a car accident, or a fall against a hard object.  · Repetitive movements with high force, such as pitching a baseball or having severe coughing spells.  SYMPTOMS   · Pain when you breathe in or cough.  · Pain when someone presses on the injured area.  DIAGNOSIS   Your caregiver will perform a physical exam. Various imaging tests may be ordered to confirm the diagnosis and to look for related injuries. These tests may include a chest X-ray, computed tomography (CT), magnetic resonance imaging (MRI), or a bone scan.  TREATMENT   Rib fractures usually heal on their own in 1 3 months. The longer healing period is often associated with a continued cough or other aggravating activities. During the healing period, pain control is very important. Medication is usually given to control pain. Hospitalization or surgery may be needed for more severe injuries, such as those in which multiple ribs are broken or the ribs have moved out of place.   HOME CARE INSTRUCTIONS   · Avoid strenuous activity and any activities or movements that cause pain. Be careful during activities and avoid bumping the injured rib.  · Gradually increase activity as directed by your caregiver.  · Only take over-the-counter or prescription medications as directed by your caregiver. Do not take other medications without asking your caregiver first.  · Apply ice  to the injured area for the first 1 2 days after you have been treated or as directed by your caregiver. Applying ice helps to reduce inflammation and pain.  · Put ice in a plastic bag.  · Place a towel between your skin and the bag.    · Leave the ice on for 15 20 minutes at a time, every 2 hours while you are awake.  · Perform deep breathing as directed by your caregiver. This will help prevent pneumonia, which is a common complication of a broken rib. Your caregiver may instruct you to:  · Take deep breaths several times a day.  · Try to cough several times a day, holding a pillow against the injured area.  · Use a device called an incentive spirometer to practice deep breathing several times a day.  · Drink enough fluids to keep your urine clear or pale yellow. This will help you avoid constipation.    · Do not wear a rib belt or binder. These restrict breathing, which can lead to pneumonia.    SEEK IMMEDIATE MEDICAL CARE IF:   · You have a fever.    · You have difficulty breathing or shortness of breath.    · You develop a continual cough, or you cough up thick or bloody sputum.  · You feel sick to your stomach (nausea), throw up (vomit), or have abdominal pain.    · You have worsening pain not controlled with medications.      MAKE SURE YOU:  · Understand these instructions.  · Will watch your condition.  · Will get help right away if you are not doing well or get worse.  Document Released: 09/30/2005 Document Revised: 06/02/2013 Document Reviewed: 12/02/2012  Exit[employer]® Patient Information ©2014 [employer].
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
## 2013-12-03 ENCOUNTER — Encounter: Admitting: Family Medicine

## 2013-12-30 ENCOUNTER — Encounter: Payer: Self-pay | Admitting: Family Medicine

## 2013-12-30 ENCOUNTER — Ambulatory Visit (INDEPENDENT_AMBULATORY_CARE_PROVIDER_SITE_OTHER): Admitting: Family Medicine

## 2013-12-30 VITALS — BP 150/88 | HR 74 | Temp 97.8°F | Resp 16 | Ht 69.5 in | Wt 206.2 lb

## 2013-12-30 DIAGNOSIS — Z0000 Encounter for general adult medical examination without abnormal findings: Secondary | ICD-10-CM

## 2013-12-30 DIAGNOSIS — E559 Vitamin D deficiency, unspecified: Secondary | ICD-10-CM

## 2013-12-30 DIAGNOSIS — E785 Hyperlipidemia, unspecified: Secondary | ICD-10-CM

## 2013-12-30 LAB — COMPLETE METABOLIC PANEL WITH GFR
ALT: 12 U/L (ref 0–53)
AST: 12 U/L (ref 0–37)
Albumin: 4.7 g/dL (ref 3.5–5.2)
Alkaline Phosphatase: 75 U/L (ref 39–117)
BUN: 9 mg/dL (ref 6–23)
CO2: 28 mEq/L (ref 19–32)
Calcium: 9.3 mg/dL (ref 8.4–10.5)
Chloride: 101 mEq/L (ref 96–112)
Creat: 1.01 mg/dL (ref 0.50–1.35)
GFR, Est African American: >89 mL/min
GFR, Est Non African American: 85 mL/min
Glucose, Bld: 102 mg/dL — ABNORMAL HIGH (ref 70–99)
Potassium: 4.1 mEq/L (ref 3.5–5.3)
Sodium: 141 mEq/L (ref 135–145)
Total Bilirubin: 0.9 mg/dL (ref 0.2–1.2)
Total Protein: 6.9 g/dL (ref 6.0–8.3)

## 2013-12-30 LAB — POCT GLYCOSYLATED HEMOGLOBIN (HGB A1C): Hemoglobin A1C: 5.2

## 2013-12-30 LAB — LIPID PANEL
Cholesterol: 187 mg/dL (ref 0–200)
HDL: 41 mg/dL (ref >39)
LDL Cholesterol: 125 mg/dL — ABNORMAL HIGH (ref 0–99)
Total CHOL/HDL Ratio: 4.6 Ratio
Triglycerides: 105 mg/dL (ref <150)
VLDL: 21 mg/dL (ref 0–40)

## 2013-12-30 LAB — IFOBT (OCCULT BLOOD): IFOBT: NEGATIVE

## 2013-12-30 LAB — PSA: PSA: 1.37 ng/mL (ref <=4.00)

## 2013-12-30 NOTE — Patient Instructions (Signed)
Keeping you healthy  Get these tests  Blood pressure- Have your blood pressure checked once a year by your healthcare provider.  Normal blood pressure is 120/80  Weight- Have your body mass index (BMI) calculated to screen for obesity.  BMI is a measure of body fat based on height and weight. You can also calculate your own BMI at ViewBanking.si.  Cholesterol- Have your cholesterol checked every year.  Diabetes- Have your blood sugar checked regularly if you have high blood pressure, high cholesterol, have a family history of diabetes or if you are overweight.  Screening for Colon Cancer- Colonoscopy starting at age 63.  Screening may begin sooner depending on your family history and other health conditions. Follow up colonoscopy as directed by your Gastroenterologist.  Screening for Prostate Cancer- Both blood work (PSA) and a rectal exam help screen for Prostate Cancer.  Screening begins at age 18 with African-American men and at age 25 with Caucasian men.  Screening may begin sooner depending on your family history.  Take these medicines  Aspirin- One aspirin daily can help prevent Heart disease and Stroke.  Flu shot- Every fall.  Tetanus- Every 10 years. Next Tetanus due in 2022.  Zostavax- Once after the age of 9 to prevent Shingles.  Pneumonia shot- Once after the age of 86; if you are younger than 58, ask your healthcare provider if you need a Pneumonia shot.  Take these steps  Don't smoke- If you do smoke, talk to your doctor about quitting.  For tips on how to quit, go to www.smokefree.gov or call 1-800-QUIT-NOW.  Be physically active- Exercise 5 days a week for at least 30 minutes.  If you are not already physically active start slow and gradually work up to 30 minutes of moderate physical activity.  Examples of moderate activity include walking briskly, mowing the yard, dancing, swimming, bicycling, etc.  Eat a healthy diet- Eat a variety of healthy food such  as fruits, vegetables, low fat milk, low fat cheese, yogurt, lean meant, poultry, fish, beans, tofu, etc. For more information go to www.thenutritionsource.org  Drink alcohol in moderation- Limit alcohol intake to less than two drinks a day. Never drink and drive.  Dentist- Brush and floss twice daily; visit your dentist twice a year.  Depression- Your emotional health is as important as your physical health. If you're feeling down, or losing interest in things you would normally enjoy please talk to your healthcare provider.  Eye exam- Visit your eye doctor every year.  Safe sex- If you may be exposed to a sexually transmitted infection, use a condom.  Seat belts- Seat belts can save your life; always wear one.  Smoke/Carbon Monoxide detectors- These detectors need to be installed on the appropriate level of your home.  Replace batteries at least once a year.  Skin cancer- When out in the sun, cover up and use sunscreen 15 SPF or higher.  Violence- If anyone is threatening you, please tell your healthcare provider.  Living Will/ Health care power of attorney- Speak with your healthcare provider and family.

## 2013-12-30 NOTE — Progress Notes (Signed)
Subjective:    Patient ID: Ralph [name], male    DOB: May 08, 1961, 53 y.o.   MRN: [id number]  HPI  This 53 y.o. 27 male is here for CPE. He has chronic back pain and sees a Pain Specialist who administers injections which are effective. Pt has no other complaints. He has been released from Dr. Lew Dawes care at [facility].  HCM:  Vision- Pt will schedule.            IMM- Current; pt declines Flu vaccine.            CRS- Current.  Patient Active Problem List   Diagnosis Date Noted  . Lumbar spinal stenosis 06/11/2012  . Polycythemia 11/10/2011  . Dyslipidemia 11/10/2011  . Depression 11/10/2011  . Right foot drop 11/10/2011  . GERD (gastroesophageal reflux disease) 11/10/2011  . Constipation, chronic 11/10/2011  . RLS (restless legs syndrome) 11/10/2011    Prior to Admission medications   Medication Sig Start Date End Date Taking? Authorizing Provider  acetaminophen-codeine (TYLENOL #3) 300-30 MG per tablet Take 1-2 tablets by mouth every 4 (four) hours as needed for moderate pain. 09/21/13  Yes Ellison Carwin, MD  aspirin 81 MG tablet Take 325 mg by mouth daily.   Yes Historical Provider, MD  buPROPion (WELLBUTRIN XL) 300 MG 24 hr tablet Take 1 tablet (300 mg total) by mouth every morning. 07/22/13  Yes Barton Fanny, MD  niacin (NIASPAN) 500 MG CR tablet Take 1 tablet (500 mg total) by mouth at bedtime. 07/22/13  Yes Barton Fanny, MD  omeprazole (PRILOSEC) 20 MG capsule Take 1 capsule (20 mg total) by mouth daily. 07/22/13  Yes Barton Fanny, MD    PMHx, Surg Hx, Soc and Fam Hx reviewed.  Review of Systems  Constitutional: Negative.   HENT: Negative.   Eyes: Negative.   Respiratory: Negative.   Cardiovascular: Negative.   Gastrointestinal: Negative.   Endocrine: Negative.   Genitourinary: Negative.   Musculoskeletal: Positive for back pain.  Skin: Negative.   Allergic/Immunologic: Negative.   Neurological: Negative.   Hematological:  Bruises/bleeds easily.       Bruising  Psychiatric/Behavioral: Negative.       Objective:   Physical Exam  Nursing note and vitals reviewed. Constitutional: He is oriented to person, place, and time. He appears well-developed and well-nourished. No distress.  HENT:  Head: Normocephalic and atraumatic.  Right Ear: Hearing, tympanic membrane, external ear and ear canal normal.  Left Ear: Hearing, tympanic membrane, external ear and ear canal normal.  Nose: Nose normal. No nasal deformity or septal deviation.  Mouth/Throat: Uvula is midline, oropharynx is clear and moist and mucous membranes are normal. No oral lesions. Normal dentition.  Eyes: Conjunctivae, EOM and lids are normal. Pupils are equal, round, and reactive to light. No scleral icterus.  Fundoscopic exam:      The right eye shows no papilledema. The right eye shows red reflex.       The left eye shows no papilledema. The left eye shows red reflex.  Difficult to assess fundi.  Neck: Trachea normal, normal range of motion and full passive range of motion without pain. Neck supple. No JVD present. No spinous process tenderness and no muscular tenderness present. Carotid bruit is not present. No mass and no thyromegaly present.  Cardiovascular: Normal rate, regular rhythm, S1 normal, S2 normal and normal heart sounds.   No extrasystoles are present. PMI is not displaced.  Exam reveals no gallop and no friction  rub.   No murmur heard. Pulses:      Radial pulses are 2+ on the right side, and 2+ on the left side.       Femoral pulses are 2+ on the right side, and 2+ on the left side.      Dorsalis pedis pulses are 1+ on the right side, and 1+ on the left side.       Posterior tibial pulses are 1+ on the right side, and 1+ on the left side.  Pulmonary/Chest: Effort normal and breath sounds normal. No respiratory distress.  Abdominal: Soft. Normal appearance, normal aorta and bowel sounds are normal. He exhibits no distension, no  pulsatile midline mass and no mass. There is no hepatosplenomegaly. There is no tenderness. There is no rebound, no guarding and no CVA tenderness. A hernia is present. Hernia confirmed positive in the ventral area. Hernia confirmed negative in the right inguinal area and confirmed negative in the left inguinal area.    Genitourinary: Rectum normal and prostate normal. Rectal exam shows no external hemorrhoid, no fissure, no mass, no tenderness and anal tone normal. Guaiac negative stool. Prostate is not enlarged and not tender.  Musculoskeletal:       Cervical back: Normal.       Thoracic back: Normal.       Lumbar back: He exhibits tenderness, deformity and spasm.  Lymphadenopathy:       Head (right side): No submental, no submandibular, no tonsillar, no posterior auricular and no occipital adenopathy present.       Head (left side): No submental, no submandibular, no tonsillar, no posterior auricular and no occipital adenopathy present.    He has no cervical adenopathy.    He has no axillary adenopathy.       Right: No inguinal and no supraclavicular adenopathy present.       Left: No inguinal and no supraclavicular adenopathy present.  Neurological: He is alert and oriented to person, place, and time. He displays atrophy. He displays no tremor. No cranial nerve deficit or sensory deficit. He exhibits abnormal muscle tone. Coordination abnormal. Gait normal.  R lower ext- foot drop with calf muscle atrophy; absent movement in digits or ankle; abnormal sensation.  Skin: Skin is warm, dry and intact. No bruising, no lesion, no petechiae and no rash noted. He is not diaphoretic. There is cyanosis. No erythema. No pallor. Nails show no clubbing.     R lower extremity cyanosis of distal 1/3 down to tips of digits.  Psychiatric: His speech is normal and behavior is normal. Judgment and thought content normal. His mood appears not anxious. His affect is blunt. His affect is not angry, not labile and  not inappropriate. Cognition and memory are normal.    Results for orders placed in visit on 12/30/13  IFOBT (OCCULT BLOOD)      Result Value Ref Range   IFOBT Negative    POCT GLYCOSYLATED HEMOGLOBIN (HGB A1C)      Result Value Ref Range   Hemoglobin A1C 5.2        Assessment & Plan:  Routine general medical examination at a health care facility - Plan: IFOBT POC (occult bld, rslt in office), PSA, COMPLETE METABOLIC PANEL WITH GFR  Unspecified vitamin D deficiency -  Vitamin D= 15 (Feb 2014)   Plan: Vitamin D, 25-hydroxy  Dyslipidemia - Plan: POCT glycosylated hemoglobin (Hb A1C), COMPLETE METABOLIC PANEL WITH GFR, Lipid panel  Continue current medications; return in 6 months or prn.

## 2013-12-31 LAB — VITAMIN D 25 HYDROXY (VIT D DEFICIENCY, FRACTURES): Vit D, 25-Hydroxy: 25 ng/mL — ABNORMAL LOW (ref 30–89)

## 2013-12-31 NOTE — Progress Notes (Signed)
Quick Note:  Please advise pt regarding following labs...  Vitamin D is a little low; get an over-the-counter Vitamin D3 supplement 2000 units and take it daily as well as get some sun exposure for 10-15 minutes most days of the week. Foods that are good sources of Vitamin D include salmon, tuna, mackerel, mushrooms, eggs and some dairy.  LDL cholesterol is a little above normal; focus on better nutrition and regular exercise. Avoid refined sugars and processed foods, fired and fatty foods and well as excess starches (breads, rice, pasta and potatoes). If you eat rice, chose brown rice and manage portion sizes better.   All other labs including kidney and liver function tests are normal. PSA (prostate blood test) is normal.  Copy to pt.  ______

## 2014-06-12 ENCOUNTER — Other Ambulatory Visit: Payer: Self-pay | Admitting: Family Medicine

## 2014-06-17 ENCOUNTER — Other Ambulatory Visit: Payer: Self-pay

## 2014-06-17 MED ORDER — OMEPRAZOLE 20 MG PO CPDR: DELAYED_RELEASE_CAPSULE | ORAL | Status: DC

## 2014-06-17 NOTE — Telephone Encounter (Signed)
Pt has appt 07/05/14.

## 2014-06-27 ENCOUNTER — Other Ambulatory Visit: Payer: Self-pay | Admitting: Family Medicine

## 2014-07-05 ENCOUNTER — Encounter: Payer: Self-pay | Admitting: Family Medicine

## 2014-07-05 ENCOUNTER — Ambulatory Visit (INDEPENDENT_AMBULATORY_CARE_PROVIDER_SITE_OTHER): Admitting: Family Medicine

## 2014-07-05 VITALS — BP 132/100 | HR 74 | Temp 98.2°F | Resp 18 | Ht 70.5 in | Wt 213.2 lb

## 2014-07-05 DIAGNOSIS — R0609 Other forms of dyspnea: Secondary | ICD-10-CM

## 2014-07-05 DIAGNOSIS — R0989 Other specified symptoms and signs involving the circulatory and respiratory systems: Secondary | ICD-10-CM

## 2014-07-05 DIAGNOSIS — R030 Elevated blood-pressure reading, without diagnosis of hypertension: Secondary | ICD-10-CM

## 2014-07-05 DIAGNOSIS — G4710 Hypersomnia, unspecified: Secondary | ICD-10-CM

## 2014-07-05 DIAGNOSIS — G4719 Other hypersomnia: Secondary | ICD-10-CM

## 2014-07-05 DIAGNOSIS — R0683 Snoring: Secondary | ICD-10-CM

## 2014-07-05 DIAGNOSIS — G479 Sleep disorder, unspecified: Secondary | ICD-10-CM

## 2014-07-05 MED ORDER — OMEPRAZOLE 20 MG PO CPDR: DELAYED_RELEASE_CAPSULE | ORAL | Status: DC

## 2014-07-05 MED ORDER — NIACIN ER (ANTIHYPERLIPIDEMIC) 500 MG PO TBCR: EXTENDED_RELEASE_TABLET | ORAL | Status: DC

## 2014-07-05 NOTE — Progress Notes (Signed)
Subjective:    Patient ID: Ralph [name], male    DOB: 1961/07/22, 53 y.o.   MRN: [id number]  HPI  This 53 y.o. Cauc male is here for medications refills. He c/o mid-afternoon sleepiness, onset a few months ago. Pt snores and his wife has been encouraging him to discuss this at visits. The problem is getting worse. He has never had a sleep study. Pt has no hx of sustained BP problems; he states he took medication for 2 days when he was in Dr. Dennie Fetters care. Pt was evaluated by Dr. Einar Gip and no further treatment was prescribed. Wife has BP cuff but pt does not check BP at home.   Patient Active Problem List   Diagnosis Date Noted  . Lumbar spinal stenosis 06/11/2012  . Polycythemia 11/10/2011  . Dyslipidemia 11/10/2011  . Depression 11/10/2011  . Right foot drop 11/10/2011  . GERD (gastroesophageal reflux disease) 11/10/2011  . Constipation, chronic 11/10/2011  . RLS (restless legs syndrome) 11/10/2011    Prior to Admission medications   Medication Sig Start Date End Date Taking? Authorizing Provider  aspirin 81 MG tablet Take 325 mg by mouth daily.   Yes Historical Provider, MD  buPROPion (WELLBUTRIN XL) 300 MG 24 hr tablet TAKE 1 TABLET EVERY MORNING 06/28/14  Yes Mancel Bale, PA-C  niacin (NIASPAN) 500 MG CR tablet TAKE 1 TABLET AT BEDTIME   Yes Barton Fanny, MD  omeprazole (PRILOSEC) 20 MG capsule TAKE 1 CAPSULE DAILY   Yes Barton Fanny, MD  acetaminophen-codeine (TYLENOL #3) 300-30 MG per tablet Take 1-2 tablets by mouth every 4 (four) hours as needed for moderate pain. 09/21/13   Roselee Culver, MD    History   Social History  . Marital Status: Married    Spouse Name: N/A    Number of Children: N/A  . Years of Education: N/A   Occupational History  . Not on file.   Social History Main Topics  . Smoking status: Former Smoker -- 1.50 packs/day for 35 years    Types: Cigarettes    Quit date: 11/14/2008  . Smokeless tobacco: Not on file  . Alcohol  Use: No  . Drug Use: Not on file  . Sexual Activity: Not on file   Other Topics Concern  . Not on file   Social History Narrative  . No narrative on file    Family History  Problem Relation Age of Onset  . Hyperlipidemia Mother   . Cancer Father      Review of Systems  Constitutional: Positive for fatigue.  Eyes: Negative for visual disturbance.  Respiratory: Negative for choking, chest tightness and shortness of breath.   Cardiovascular: Negative for chest pain and palpitations.  Endocrine: Negative.   Neurological: Negative.   Psychiatric/Behavioral: Positive for sleep disturbance.      Objective:   Physical Exam  Nursing note and vitals reviewed. Constitutional: He is oriented to person, place, and time. He appears well-developed and well-nourished. No distress.  HENT:  Head: Normocephalic.  Right Ear: External ear normal.  Left Ear: External ear normal.  Nose: Nose normal.  Mouth/Throat: Oropharynx is clear and moist.  Eyes: Conjunctivae and EOM are normal. Pupils are equal, round, and reactive to light. No scleral icterus.  Neck: Neck supple.  Cardiovascular: Normal rate and regular rhythm.   Pulmonary/Chest: Effort normal. No respiratory distress.  Neurological: He is alert and oriented to person, place, and time. No cranial nerve deficit. He exhibits normal muscle  tone. Coordination normal.  Pt walks w/ a slight limp.  Skin: Skin is warm and dry. He is not diaphoretic.  Psychiatric: His behavior is normal. Judgment and thought content normal.  Flat affect.       Assessment & Plan:  Sleep disturbance - Plan: Ambulatory referral to Neurology  Snores - Plan: Ambulatory referral to Neurology  Excessive daytime sleepiness - Plan: Ambulatory referral to Neurology  Elevated blood pressure reading without diagnosis of hypertension- Advised weight loss and increased physical activity.   Meds ordered this encounter  Medications  . niacin (NIASPAN) 500 MG CR  tablet    Sig: TAKE 1 TABLET AT BEDTIME    Dispense:  90 tablet    Refill:  3  . omeprazole (PRILOSEC) 20 MG capsule    Sig: TAKE 1 CAPSULE DAILY    Dispense:  90 capsule    Refill:  3

## 2014-07-05 NOTE — Patient Instructions (Addendum)
Your blood pressure is above normal (the number on the bottom should be below 90). Weight reduction and better nutrition would help normalize this number. Evaluation for sleep apnea is crucial.   Below is some information about MEDITERRANEAN DIET which is a healthy way of eating and will help with weight reduction. This will also help improve your lipid profile.      Mediterranean Diet  Why follow it? Research shows.   Those who follow the Mediterranean diet have a reduced risk of heart disease    The diet is associated with a reduced incidence of Parkinson's and Alzheimer's diseases   People following the diet may have longer life expectancies and lower rates of chronic diseases    The Dietary Guidelines for Americans recommends the Mediterranean diet as an eating plan to promote health and prevent disease  What Is the Mediterranean Diet?    Healthy eating plan based on typical foods and recipes of Mediterranean-style cooking   The diet is primarily a plant based diet; these foods should make up a majority of meals   Starches - Plant based foods should make up a majority of meals - They are an important sources of vitamins, minerals, energy, antioxidants, and fiber - Choose whole grains, foods high in fiber and minimally processed items  - Typical grain sources include wheat, oats, barley, corn, brown rice, bulgar, farro, millet, polenta, couscous  - Various types of beans include chickpeas, lentils, fava beans, black beans, white beans   Fruits  Veggies - Large quantities of antioxidant rich fruits & veggies; 6 or more servings  - Vegetables can be eaten raw or lightly drizzled with oil and cooked  - Vegetables common to the traditional Mediterranean Diet include: artichokes, arugula, beets, broccoli, brussel sprouts, cabbage, carrots, celery, collard greens, cucumbers, eggplant, kale, leeks, lemons, lettuce, mushrooms, okra, onions, peas, peppers, potatoes, pumpkin, radishes, rutabaga,  shallots, spinach, sweet potatoes, turnips, zucchini - Fruits common to the Mediterranean Diet include: apples, apricots, avocados, cherries, clementines, dates, figs, grapefruits, grapes, melons, nectarines, oranges, peaches, pears, pomegranates, strawberries, tangerines  Fats - Replace butter and margarine with healthy oils, such as olive oil, canola oil, and tahini  - Limit nuts to no more than a handful a day  - Nuts include walnuts, almonds, pecans, pistachios, pine nuts  - Limit or avoid candied, honey roasted or heavily salted nuts - Olives are central to the [employer] - can be eaten whole or used in a variety of dishes   Meats Protein - Limiting red meat: no more than a few times a month - When eating red meat: choose lean cuts and keep the portion to the size of deck of cards - Eggs: approx. 0 to 4 times a week  - Fish and lean poultry: at least 2 a week  - Healthy protein sources include, chicken, [place], lean beef, lamb - Increase intake of seafood such as tuna, salmon, trout, mackerel, shrimp, scallops - Avoid or limit high fat processed meats such as sausage and bacon  Dairy - Include moderate amounts of low fat dairy products  - Focus on healthy dairy such as fat free yogurt, skim milk, low or reduced fat cheese - Limit dairy products higher in fat such as whole or 2% milk, cheese, ice cream  Alcohol - Moderate amounts of red wine is ok  - No more than 5 oz daily for women (all ages) and men older than age 17  - No more than 10 oz of  wine daily for men younger than 71  Other - Limit sweets and other desserts  - Use herbs and spices instead of salt to flavor foods  - Herbs and spices common to the traditional Mediterranean Diet include: basil, bay leaves, chives, cloves, cumin, fennel, garlic, lavender, marjoram, mint, oregano, parsley, pepper, rosemary, sage, savory, sumac, tarragon, thyme   It's not just a diet, it's a lifestyle:    The Mediterranean diet includes  lifestyle factors typical of those in the region    Foods, drinks and meals are best eaten with others and savored   Daily physical activity is important for overall good health   This could be strenuous exercise like running and aerobics   This could also be more leisurely activities such as walking, housework, yard-work, or taking the stairs   Moderation is the key; a balanced and healthy diet accommodates most foods and drinks   Consider portion sizes and frequency of consumption of certain foods   Meal Ideas & Options:    Breakfast:  o Whole wheat toast or whole wheat English muffins with peanut butter & hard boiled egg o Steel cut oats topped with apples & cinnamon and skim milk  o Fresh fruit: banana, strawberries, melon, berries, peaches  o Smoothies: strawberries, bananas, greek yogurt, peanut butter o Low fat greek yogurt with blueberries and granola  o Egg white omelet with spinach and mushrooms o Breakfast couscous: whole wheat couscous, apricots, skim milk, cranberries    Sandwiches:  o Hummus and grilled vegetables (peppers, zucchini, squash) on whole wheat bread   o Grilled chicken on whole wheat pita with lettuce, tomatoes, cucumbers or tzatziki  o Tuna salad on whole wheat bread: tuna salad made with greek yogurt, olives, red peppers, capers, green onions o Garlic rosemary lamb pita: lamb sauted with garlic, rosemary, salt & pepper; add lettuce, cucumber, greek yogurt to pita - flavor with lemon juice and black pepper    Seafood:  o Mediterranean grilled salmon, seasoned with garlic, basil, parsley, lemon juice and black pepper o Shrimp, lemon, and spinach whole-grain pasta salad made with low fat greek yogurt  o Seared scallops with lemon orzo  o Seared tuna steaks seasoned salt, pepper, coriander topped with tomato mixture of olives, tomatoes, olive oil, minced garlic, parsley, green onions and cappers    Meats:  o Herbed greek chicken salad with kalamata olives,  cucumber, feta  o Red bell peppers stuffed with spinach, bulgur, lean ground beef (or lentils) & topped with feta   o Kebabs: skewers of chicken, tomatoes, onions, zucchini, squash  o [place] burgers: made with red onions, mint, dill, lemon juice, feta cheese topped with roasted red peppers   Vegetarian o Cucumber salad: cucumbers, artichoke hearts, celery, red onion, feta cheese, tossed in olive oil & lemon juice  o Hummus and whole grain pita points with a greek salad (lettuce, tomato, feta, olives, cucumbers, red onion) o Lentil soup with celery, carrots made with vegetable broth, garlic, salt and pepper  o Tabouli salad: parsley, bulgur, mint, scallions, cucumbers, tomato, radishes, lemon juice, olive oil, salt and pepper.       Sleep Apnea Sleep apnea is disorder that affects a person's sleep. A person with sleep apnea has abnormal pauses in their breathing when they sleep. It is hard for them to get a good sleep. This makes a person tired during the day. It also can lead to other physical problems. There are three types of sleep apnea. One type is  when breathing stops for a short time because your airway is blocked (obstructive sleep apnea). Another type is when the brain sometimes fails to give the normal signal to breathe to the muscles that control your breathing (central sleep apnea). The third type is a combination of the other two types. HOME CARE  Do not sleep on your back. Try to sleep on your side.  Take all medicine as told by your doctor.  Avoid alcohol, calming medicines (sedatives), and depressant drugs.  Try to lose weight if you are overweight. Talk to your doctor about a healthy weight goal. Your doctor may have you use a device that helps to open your airway. It can help you get the air that you need. It is called a positive airway pressure (PAP) device. There are three types of PAP devices:  Continuous positive airway pressure (CPAP) device.  Nasal expiratory  positive airway pressure (EPAP) device.  Bilevel positive airway pressure (BPAP) device. MAKE SURE YOU:  Understand these instructions.  Will watch your condition.  Will get help right away if you are not doing well or get worse. Document Released: 07/09/2008 Document Revised: 09/16/2012 Document Reviewed: 02/01/2012 [facility] Patient Information 2015 [place], [place]. This information is not intended to replace advice given to you by your health care provider. Make sure you discuss any questions you have with your health care provider.

## 2014-09-27 ENCOUNTER — Ambulatory Visit (INDEPENDENT_AMBULATORY_CARE_PROVIDER_SITE_OTHER): Admitting: Family Medicine

## 2014-09-27 ENCOUNTER — Encounter: Payer: Self-pay | Admitting: Family Medicine

## 2014-09-27 VITALS — BP 132/83 | HR 82 | Temp 97.6°F | Resp 16 | Ht 70.0 in | Wt 214.0 lb

## 2014-09-27 DIAGNOSIS — G479 Sleep disorder, unspecified: Secondary | ICD-10-CM

## 2014-09-27 DIAGNOSIS — R0683 Snoring: Secondary | ICD-10-CM

## 2014-09-27 NOTE — Progress Notes (Signed)
S:  This 53 y.o. Cauc male is here for follow-up; had [facility]. Feels well today. At last visit, he mentioned sleep disturbance and snoring; referral placed to Neurology but pt did not here about an appointment.  Patient Active Problem List   Diagnosis Date Noted  . Lumbar spinal stenosis 06/11/2012  . Polycythemia 11/10/2011  . Dyslipidemia 11/10/2011  . Depression 11/10/2011  . Right foot drop 11/10/2011  . GERD (gastroesophageal reflux disease) 11/10/2011  . Constipation, chronic 11/10/2011  . RLS (restless legs syndrome) 11/10/2011    Prior to Admission medications   Medication Sig Start Date End Date Taking? Authorizing Provider  aspirin 325 MG EC tablet Take 325 mg by mouth daily.   Yes Historical Provider, MD  buPROPion (WELLBUTRIN XL) 300 MG 24 hr tablet TAKE 1 TABLET EVERY MORNING 06/28/14  Yes Mancel Bale, PA-C  cholecalciferol (VITAMIN D) 1000 UNITS tablet Take 2,000 Units by mouth daily.   Yes Historical Provider, MD  niacin (NIASPAN) 500 MG CR tablet TAKE 1 TABLET AT BEDTIME 07/05/14  Yes Barton Fanny, MD  omeprazole (PRILOSEC) 20 MG capsule TAKE 1 CAPSULE DAILY 07/05/14  Yes Barton Fanny, MD    ROS: Noncontributory.  O: Filed Vitals:   09/27/14 1637  BP: 132/83  Pulse: 82  Temp: 97.6 F (36.4 C)  Resp: 16    GEN: In NAD; WN, WD. HENT: [place]/AT; EOMI w/ clear conj/sclerae. Otherwise unremarkable. COR: RRR. LUNGS: Unlabored resp. SKIN: W&D; intact w/o diaphoresis, erythema or pallor. NEURO: A&O x 3; CNs intact. Nonfocal.  A/P: Snoring - Plan: Nocturnal polysomnography  Sleep disturbance - Plan: Ambulatory referral to Neurology, Nocturnal polysomnography

## 2014-10-04 ENCOUNTER — Other Ambulatory Visit: Payer: Self-pay | Admitting: Radiology

## 2014-10-04 DIAGNOSIS — R0683 Snoring: Secondary | ICD-10-CM

## 2014-10-27 ENCOUNTER — Encounter: Payer: Self-pay | Admitting: Neurology

## 2014-10-27 ENCOUNTER — Ambulatory Visit (INDEPENDENT_AMBULATORY_CARE_PROVIDER_SITE_OTHER): Admitting: Neurology

## 2014-10-27 VITALS — BP 141/92 | HR 76 | Temp 97.9°F | Resp 15 | Ht 70.0 in | Wt 217.0 lb

## 2014-10-27 DIAGNOSIS — E669 Obesity, unspecified: Secondary | ICD-10-CM

## 2014-10-27 DIAGNOSIS — R0683 Snoring: Secondary | ICD-10-CM

## 2014-10-27 DIAGNOSIS — R351 Nocturia: Secondary | ICD-10-CM

## 2014-10-27 DIAGNOSIS — M21371 Foot drop, right foot: Secondary | ICD-10-CM

## 2014-10-27 DIAGNOSIS — G4719 Other hypersomnia: Secondary | ICD-10-CM

## 2014-10-27 DIAGNOSIS — G478 Other sleep disorders: Secondary | ICD-10-CM

## 2014-10-27 DIAGNOSIS — G2581 Restless legs syndrome: Secondary | ICD-10-CM
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
## 2014-10-27 NOTE — Patient Instructions (Signed)

## 2014-11-21 ENCOUNTER — Ambulatory Visit (INDEPENDENT_AMBULATORY_CARE_PROVIDER_SITE_OTHER): Admitting: Neurology

## 2014-11-21 DIAGNOSIS — G479 Sleep disorder, unspecified: Secondary | ICD-10-CM

## 2014-11-21 DIAGNOSIS — G4761 Periodic limb movement disorder: Secondary | ICD-10-CM

## 2014-11-21 DIAGNOSIS — G4733 Obstructive sleep apnea (adult) (pediatric): Secondary | ICD-10-CM

## 2014-11-21 NOTE — Sleep Study (Signed)
Please see the scanned sleep study interpretation located in the Procedure tab within the Chart Review section. 

## 2014-11-30 ENCOUNTER — Telehealth: Payer: Self-pay | Admitting: Neurology

## 2014-11-30 DIAGNOSIS — G4733 Obstructive sleep apnea (adult) (pediatric): Secondary | ICD-10-CM

## 2014-11-30 NOTE — Telephone Encounter (Signed)
Please call and notify patient that the recent sleep study confirmed the diagnosis of OSA. He did well with CPAP during the study with significant improvement of the respiratory events. Therefore, I would like start the patient on CPAP at home. I placed the order in the chart.   Arrange for CPAP set up at home through a DME company of patient's choice and fax/route report to PCP and referring MD (if other than PCP).   The patient will also need a follow up appointment with me in 6-8 weeks post set up that has to be scheduled; help the patient schedule this (in a follow-up slot).   Please re-enforce the importance of compliance with treatment and the need for us to monitor compliance data.   Once you have spoken to the patient and scheduled the return appointment, you may close this encounter, thanks,   Tessia Kassin, MD, PhD [facility] ([facility]    

## 2014-12-01 ENCOUNTER — Encounter: Payer: Self-pay | Admitting: Neurology

## 2014-12-01 NOTE — Telephone Encounter (Signed)
Patient was contacted and provided the results of his split night sleep study which revealed severe obstructive sleep apnea.  Patient was informed that CPAP therapy was recommended by Dr. Rexene Alberts for treatment.  The patient was in agreement and was referred to [place] for CPAP set up.  Dr. Ellsworth Lennox was faxed a copy of the results.  The patient gave verbal permission to mail a copy of his test results.   Patient instructed to contact our office 6-8 weeks post set up to schedule a follow up appointment.

## 2014-12-02 ENCOUNTER — Encounter: Payer: Self-pay | Admitting: *Deleted

## 2015-01-09 ENCOUNTER — Encounter: Payer: Self-pay | Admitting: Neurology

## 2015-01-16 ENCOUNTER — Encounter: Payer: Self-pay | Admitting: Neurology

## 2015-01-16 ENCOUNTER — Ambulatory Visit (INDEPENDENT_AMBULATORY_CARE_PROVIDER_SITE_OTHER): Admitting: Neurology

## 2015-01-16 VITALS — BP 134/89 | HR 80 | Temp 98.0°F | Resp 18 | Ht 70.0 in | Wt 212.0 lb

## 2015-01-16 DIAGNOSIS — M21371 Foot drop, right foot: Secondary | ICD-10-CM

## 2015-01-16 DIAGNOSIS — G4733 Obstructive sleep apnea (adult) (pediatric): Secondary | ICD-10-CM | POA: Diagnosis not present

## 2015-01-16 DIAGNOSIS — R0981 Nasal congestion: Secondary | ICD-10-CM

## 2015-01-16 DIAGNOSIS — J342 Deviated nasal septum: Secondary | ICD-10-CM | POA: Diagnosis not present

## 2015-01-16 DIAGNOSIS — Z9989 Dependence on other enabling machines and devices: Principal | ICD-10-CM

## 2015-01-16 DIAGNOSIS — G2581 Restless legs syndrome: Secondary | ICD-10-CM | POA: Diagnosis not present
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
## 2015-01-16 NOTE — Patient Instructions (Signed)

## 2015-01-27 ENCOUNTER — Encounter: Payer: Self-pay | Admitting: Family Medicine

## 2015-01-27 ENCOUNTER — Ambulatory Visit (INDEPENDENT_AMBULATORY_CARE_PROVIDER_SITE_OTHER): Admitting: Family Medicine

## 2015-01-27 VITALS — BP 148/92 | HR 82 | Temp 97.9°F | Resp 16 | Ht 70.0 in | Wt 215.2 lb

## 2015-01-27 DIAGNOSIS — Z1211 Encounter for screening for malignant neoplasm of colon: Secondary | ICD-10-CM | POA: Diagnosis not present

## 2015-01-27 DIAGNOSIS — R5382 Chronic fatigue, unspecified: Secondary | ICD-10-CM | POA: Diagnosis not present

## 2015-01-27 DIAGNOSIS — M79674 Pain in right toe(s): Secondary | ICD-10-CM | POA: Diagnosis not present

## 2015-01-27 DIAGNOSIS — E7800 Pure hypercholesterolemia, unspecified: Secondary | ICD-10-CM

## 2015-01-27 DIAGNOSIS — Z79899 Other long term (current) drug therapy: Secondary | ICD-10-CM

## 2015-01-27 DIAGNOSIS — L603 Nail dystrophy: Secondary | ICD-10-CM | POA: Diagnosis not present

## 2015-01-27 DIAGNOSIS — G90521 Complex regional pain syndrome I of right lower limb: Secondary | ICD-10-CM

## 2015-01-27 DIAGNOSIS — Z0000 Encounter for general adult medical examination without abnormal findings: Secondary | ICD-10-CM | POA: Diagnosis not present

## 2015-01-27 DIAGNOSIS — Z1212 Encounter for screening for malignant neoplasm of rectum: Secondary | ICD-10-CM | POA: Diagnosis not present

## 2015-01-27 DIAGNOSIS — M25551 Pain in right hip: Secondary | ICD-10-CM | POA: Diagnosis not present

## 2015-01-27 LAB — CBC WITH DIFFERENTIAL/PLATELET
Basophils Absolute: 0 10*3/uL (ref 0.0–0.1)
Basophils Relative: 0 % (ref 0–1)
Eosinophils Absolute: 0.2 10*3/uL (ref 0.0–0.7)
Eosinophils Relative: 2 % (ref 0–5)
HCT: 47.7 % (ref 39.0–52.0)
Hemoglobin: 17.2 g/dL — ABNORMAL HIGH (ref 13.0–17.0)
Lymphocytes Relative: 33 % (ref 12–46)
Lymphs Abs: 2.9 10*3/uL (ref 0.7–4.0)
MCH: 32.4 pg (ref 26.0–34.0)
MCHC: 36.1 g/dL — ABNORMAL HIGH (ref 30.0–36.0)
MCV: 89.8 fL (ref 78.0–100.0)
MPV: 9.2 fL (ref 8.6–12.4)
Monocytes Absolute: 0.4 10*3/uL (ref 0.1–1.0)
Monocytes Relative: 5 % (ref 3–12)
Neutro Abs: 5.3 10*3/uL (ref 1.7–7.7)
Neutrophils Relative %: 60 % (ref 43–77)
Platelets: 175 10*3/uL (ref 150–400)
RBC: 5.31 MIL/uL (ref 4.22–5.81)
RDW: 14.5 % (ref 11.5–15.5)
WBC: 8.9 10*3/uL (ref 4.0–10.5)

## 2015-01-27 MED ORDER — BUPROPION HCL ER (XL) 300 MG PO TB24: 300.0000 mg | ORAL_TABLET | Freq: Every morning | ORAL | Status: DC

## 2015-01-27 MED ORDER — METANX 3-90.314-2-35 MG PO CAPS: 1.0000 | ORAL_CAPSULE | Freq: Two times a day (BID) | ORAL | Status: DC

## 2015-01-27 MED ORDER — POLYETHYLENE GLYCOL 3350 17 GM/SCOOP PO POWD: 17.0000 g | Freq: Every day | ORAL | Status: DC

## 2015-01-27 NOTE — Patient Instructions (Addendum)
Keeping you healthy  Get these tests  Blood pressure- Have your blood pressure checked once a year by your healthcare provider.  Normal blood pressure is 120/80  Weight- Have your body mass index (BMI) calculated to screen for obesity.  BMI is a measure of body fat based on height and weight. You can also calculate your own BMI at [email or web address].  Cholesterol- Have your cholesterol checked every year.  Diabetes- Have your blood sugar checked regularly if you have high blood pressure, high cholesterol, have a family history of diabetes or if you are overweight.  Screening for Colon Cancer- Colonoscopy starting at age 5.  Screening may begin sooner depending on your family history and other health conditions. Follow up colonoscopy as directed by your Gastroenterologist.  Screening for Prostate Cancer- Both blood work (PSA) and a rectal exam help screen for Prostate Cancer.  Screening begins at age 94 with African-American men and at age 71 with Caucasian men.  Screening may begin sooner depending on your family history.  Take these medicines  Aspirin- One aspirin daily can help prevent Heart disease and Stroke.  Flu shot- Every fall.  Tetanus- Every 10 years.  Zostavax- Once after the age of 38 to prevent Shingles.  Pneumonia shot- Once after the age of 69; if you are younger than 1, ask your healthcare provider if you need a Pneumonia shot.  Take these steps  Don't smoke- If you do smoke, talk to your doctor about quitting.  For tips on how to quit, go to www.smokefree.gov or call 1-800-QUIT-NOW.  Be physically active- Exercise 5 days a week for at least 30 minutes.  If you are not already physically active start slow and gradually work up to 30 minutes of moderate physical activity.  Examples of moderate activity include walking briskly, mowing the yard, dancing, swimming, bicycling, etc.  Eat a healthy diet- Eat a variety of healthy food such as fruits, vegetables, low  fat milk, low fat cheese, yogurt, lean meant, poultry, fish, beans, tofu, etc. For more information go to www.thenutritionsource.org  Drink alcohol in moderation- Limit alcohol intake to less than two drinks a day. Never drink and drive.  Dentist- Brush and floss twice daily; visit your dentist twice a year.  Depression- Your emotional health is as important as your physical health. If you're feeling down, or losing interest in things you would normally enjoy please talk to your healthcare provider.  Eye exam- Visit your eye doctor every year.  Safe sex- If you may be exposed to a sexually transmitted infection, use a condom.  Seat belts- Seat belts can save your life; always wear one.  Smoke/Carbon Monoxide detectors- These detectors need to be installed on the appropriate level of your home.  Replace batteries at least once a year.  Skin cancer- When out in the sun, cover up and use sunscreen 15 SPF or higher.  Violence- If anyone is threatening you, please tell your healthcare provider.  Living Will/ Health care power of attorney- Speak with your healthcare provider and family.   [facility] to [facility].

## 2015-01-28 LAB — BASIC METABOLIC PANEL WITH GFR
BUN: 10 mg/dL (ref 6–23)
CO2: 28 meq/L (ref 19–32)
Calcium: 9.3 mg/dL (ref 8.4–10.5)
Chloride: 104 meq/L (ref 96–112)
Creat: 0.92 mg/dL (ref 0.50–1.35)
Glucose, Bld: 100 mg/dL — ABNORMAL HIGH (ref 70–99)
Potassium: 4.6 meq/L (ref 3.5–5.3)
Sodium: 143 meq/L (ref 135–145)

## 2015-01-28 LAB — LDL CHOLESTEROL, DIRECT: Direct LDL: 120 mg/dL — ABNORMAL HIGH

## 2015-01-28 LAB — FERRITIN: Ferritin: 122 ng/mL (ref 22–322)

## 2015-01-28 LAB — PSA: PSA: 1.92 ng/mL (ref <=4.00)

## 2015-01-28 LAB — MAGNESIUM: Magnesium: 2 mg/dL (ref 1.5–2.5)

## 2015-01-30 ENCOUNTER — Encounter: Payer: Self-pay | Admitting: Family Medicine
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
## 2015-01-31 ENCOUNTER — Telehealth: Payer: Self-pay

## 2015-01-31 MED ORDER — VITAMIN-B COMPLEX PO TABS: 1.0000 | ORAL_TABLET | Freq: Every day | ORAL | Status: DC

## 2015-01-31 NOTE — Telephone Encounter (Signed)
Mentanx caps are not covered by pt's insurance. Can we send in an alternative. Please advise.

## 2015-01-31 NOTE — Telephone Encounter (Signed)
I ordered a B complex vitamin (it is OTC but may be covered by plan).

## 2015-02-01 LAB — TESTOSTERONE, FREE, TOTAL, SHBG
Sex Hormone Binding: 50 nmol/L (ref 10–50)
Testosterone, Free: 57.8 pg/mL (ref 47.0–244.0)
Testosterone-% Free: 1.5 % — ABNORMAL LOW (ref 1.6–2.9)
Testosterone: 376.22 ng/dL (ref 300–890)

## 2015-02-01 NOTE — Telephone Encounter (Signed)
Pt.notified

## 2015-02-02 NOTE — Progress Notes (Signed)
Quick Note:  Please advise pt regarding following labs... I have reviewed your lab results. Blood counts are normal. Sodium, potassium, blood sugar and kidney function is normal. Prostate blood test is normal. LDL ("bad") cholesterol is slightly elevated. It is about the same as one year ago. Your blood test show that you have a low- normal testosterone level; this may explain your low energy and fatigue. If you want further evaluation of this problem, I can refer you to a specialist (Endocrinology).   Continue current medications.  Copy to pt.   ______

## 2015-02-03 ENCOUNTER — Other Ambulatory Visit: Payer: Self-pay | Admitting: Family Medicine

## 2015-02-03 DIAGNOSIS — R7989 Other specified abnormal findings of blood chemistry: Secondary | ICD-10-CM

## 2015-02-03 DIAGNOSIS — R5382 Chronic fatigue, unspecified: Secondary | ICD-10-CM

## 2015-02-10 ENCOUNTER — Ambulatory Visit (INDEPENDENT_AMBULATORY_CARE_PROVIDER_SITE_OTHER): Admitting: Endocrinology

## 2015-02-10 ENCOUNTER — Telehealth: Payer: Self-pay | Admitting: Endocrinology

## 2015-02-10 ENCOUNTER — Encounter: Payer: Self-pay | Admitting: Endocrinology

## 2015-02-10 VITALS — BP 130/88 | HR 77 | Temp 98.2°F | Ht 70.0 in | Wt 215.0 lb

## 2015-02-10 DIAGNOSIS — D751 Secondary polycythemia: Secondary | ICD-10-CM

## 2015-02-10 NOTE — Telephone Encounter (Signed)
.  lbendo

## 2015-02-10 NOTE — Patient Instructions (Addendum)
With the polycythemia, it is too dangerous to take testosterone.  i can't find any cause for your symptoms. i hope you feel better soon.   Please come back as needed.

## 2015-02-10 NOTE — Telephone Encounter (Signed)
No need to reschedule.
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
## 2015-02-10 NOTE — Telephone Encounter (Signed)
Patient no showed for appointment please advise if we should reschedule. Thanks!

## 2015-02-20 ENCOUNTER — Other Ambulatory Visit: Payer: Self-pay | Admitting: Family Medicine

## 2015-02-21 ENCOUNTER — Encounter: Payer: Self-pay | Admitting: Family Medicine

## 2015-05-23 ENCOUNTER — Other Ambulatory Visit: Payer: Self-pay | Admitting: Family Medicine

## 2015-06-23 ENCOUNTER — Ambulatory Visit (INDEPENDENT_AMBULATORY_CARE_PROVIDER_SITE_OTHER): Admitting: Physician Assistant

## 2015-06-23 VITALS — BP 124/76 | HR 76 | Temp 98.1°F | Resp 18 | Ht 70.0 in | Wt 216.8 lb

## 2015-06-23 DIAGNOSIS — S30860A Insect bite (nonvenomous) of lower back and pelvis, initial encounter: Secondary | ICD-10-CM | POA: Diagnosis not present

## 2015-06-23 DIAGNOSIS — W57XXXA Bitten or stung by nonvenomous insect and other nonvenomous arthropods, initial encounter: Secondary | ICD-10-CM

## 2015-06-23 MED ORDER — DOXYCYCLINE HYCLATE 100 MG PO CAPS: 200.0000 mg | ORAL_CAPSULE | Freq: Once | ORAL | Status: DC

## 2015-06-23 NOTE — Patient Instructions (Signed)
Tick Bite Information Ticks are insects that attach themselves to the skin and draw blood for food. There are various types of ticks. Common types include wood ticks and deer ticks. Most ticks live in shrubs and grassy areas. Ticks can climb onto your body when you make contact with leaves or grass where the tick is waiting. The most common places on the body for ticks to attach themselves are the scalp, neck, armpits, waist, and groin. Most tick bites are harmless, but sometimes ticks carry germs that cause diseases. These germs can be spread to a person during the tick's feeding process. The chance of a disease spreading through a tick bite depends on:   The type of tick.  Time of year.   How long the tick is attached.   Geographic location.  HOW CAN YOU PREVENT TICK BITES? Take these steps to help prevent tick bites when you are outdoors:  Wear protective clothing. Long sleeves and long pants are best.   Wear white clothes so you can see ticks more easily.  Tuck your pant legs into your socks.   If walking on a trail, stay in the middle of the trail to avoid brushing against bushes.  Avoid walking through areas with long grass.  Put insect repellent on all exposed skin and along boot tops, pant legs, and sleeve cuffs.   Check clothing, hair, and skin repeatedly and before going inside.   Brush off any ticks that are not attached.  Take a shower or bath as soon as possible after being outdoors.  WHAT IS THE PROPER WAY TO REMOVE A TICK? Ticks should be removed as soon as possible to help prevent diseases caused by tick bites. 1. If latex gloves are available, put them on before trying to remove a tick.  2. Using fine-point tweezers, grasp the tick as close to the skin as possible. You may also use curved forceps or a tick removal tool. Grasp the tick as close to its head as possible. Avoid grasping the tick on its body. 3. Pull gently with steady upward pressure until  the tick lets go. Do not twist the tick or jerk it suddenly. This may break off the tick's head or mouth parts. 4. Do not squeeze or crush the tick's body. This could force disease-carrying fluids from the tick into your body.  5. After the tick is removed, wash the bite area and your hands with soap and water or other disinfectant such as alcohol. 6. Apply a small amount of antiseptic cream or ointment to the bite site.  7. Wash and disinfect any instruments that were used.  Do not try to remove a tick by applying a hot match, petroleum jelly, or fingernail polish to the tick. These methods do not work and may increase the chances of disease being spread from the tick bite.  WHEN SHOULD YOU SEEK MEDICAL CARE? Contact your health care provider if you are unable to remove a tick from your skin or if a part of the tick breaks off and is stuck in the skin.  After a tick bite, you need to be aware of signs and symptoms that could be related to diseases spread by ticks. Contact your health care provider if you develop any of the following in the days or weeks after the tick bite:  Unexplained fever.  Rash. A circular rash that appears days or weeks after the tick bite may indicate the possibility of Lyme disease. The rash may resemble   a target with a bull's-eye and may occur at a different part of your body than the tick bite.  Redness and swelling in the area of the tick bite.   Tender, swollen lymph glands.   Diarrhea.   Weight loss.   Cough.   Fatigue.   Muscle, joint, or bone pain.   Abdominal pain.   Headache.   Lethargy or a change in your level of consciousness.  Difficulty walking or moving your legs.   Numbness in the legs.   Paralysis.  Shortness of breath.   Confusion.   Repeated vomiting.  Document Released: 09/27/2000 Document Revised: 07/21/2013 Document Reviewed: 03/10/2013 [employer] Patient Information 2015 [employer]. This information is  not intended to replace advice given to you by your health care provider. Make sure you discuss any questions you have with your health care provider.  

## 2015-06-23 NOTE — Progress Notes (Signed)
   Subjective:    Patient ID: Ralph [name], male    DOB: 1960/12/24, 54 y.o.   MRN: [id number]  HPI Patient presents for possible infection of tick bite located on right lower buttock. Wife removed tick from patient yesterday and unsure when he could have picked tick up [place]. Endorses some discomfort at bite site and itching on leg. Denies paralysis, N/V, or abdominal cramping. Bite site has not had any increased redness and has not noticed rash.  Allergies  Allergen Reactions  . Antihistamine [Diphenhydramine Hcl] Other (See Comments)    nightmares  . Hydrocodone Rash  . Penicillins Rash   Review of Systems  Constitutional: Negative for fever, chills, appetite change and fatigue.  Respiratory: Negative for shortness of breath.   Gastrointestinal: Negative for nausea, vomiting and abdominal pain.  Musculoskeletal: Negative for myalgias, arthralgias and gait problem.  Skin: Positive for wound. Negative for rash.  Neurological: Negative for weakness, numbness and headaches.       Objective:   Physical Exam  Constitutional: He is oriented to person, place, and time. He appears well-developed and well-nourished. No distress.  Blood pressure 124/76, pulse 76, temperature 98.1 F (36.7 C), temperature source Oral, resp. rate 18, height 5\' 10"  (1.778 m), weight 216 lb 12.8 oz (98.34 kg), SpO2 95 %.   HENT:  Head: Normocephalic and atraumatic.  Right Ear: External ear normal.  Left Ear: External ear normal.  Eyes: Conjunctivae are normal. Right eye exhibits no discharge. Left eye exhibits no discharge. No scleral icterus.  Pulmonary/Chest: Effort normal.  Neurological: He is alert and oriented to person, place, and time.  Skin: Skin is warm and dry. No rash noted. He is not diaphoretic. No erythema. No pallor.  Pen cap size area of erythema with centrally located bite mark without drainage and no rashes present. Tick head not present.   Psychiatric: He  has a normal mood and affect. His behavior is normal. Judgment and thought content normal.      Assessment & Plan:  1. Tick bite Anticipatory guidance discussed. Red flags discusses as well and when to go to ER. - doxycycline (VIBRAMYCIN) 100 MG capsule; Take 2 capsules (200 mg total) by mouth once.  Dispense: 2 capsule; Refill: 0   Mandeep Kiser PA-C  [facility] 06/23/2015 12:55 PM

## 2015-07-19 ENCOUNTER — Ambulatory Visit (INDEPENDENT_AMBULATORY_CARE_PROVIDER_SITE_OTHER): Admitting: Neurology

## 2015-07-19 ENCOUNTER — Encounter: Payer: Self-pay | Admitting: Neurology

## 2015-07-19 VITALS — BP 136/88 | HR 76 | Resp 16 | Ht 70.0 in | Wt 214.0 lb

## 2015-07-19 DIAGNOSIS — R0981 Nasal congestion: Secondary | ICD-10-CM

## 2015-07-19 DIAGNOSIS — M21371 Foot drop, right foot: Secondary | ICD-10-CM | POA: Diagnosis not present

## 2015-07-19 DIAGNOSIS — Z9989 Dependence on other enabling machines and devices: Principal | ICD-10-CM

## 2015-07-19 DIAGNOSIS — J342 Deviated nasal septum: Secondary | ICD-10-CM | POA: Diagnosis not present

## 2015-07-19 DIAGNOSIS — G4733 Obstructive sleep apnea (adult) (pediatric): Secondary | ICD-10-CM
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
## 2015-07-19 NOTE — Patient Instructions (Addendum)
Please continue using your CPAP regularly. While your insurance requires that you use CPAP at least 4 hours each night on 70% of the nights, I recommend, that you not skip any nights and use it throughout the night if you can. Getting used to CPAP and staying with the treatment long term does take time and patience and discipline. Untreated obstructive sleep apnea when it is moderate to severe can have an adverse impact on cardiovascular health and raise her risk for heart disease, arrhythmias, hypertension, congestive heart failure, stroke and diabetes. Untreated obstructive sleep apnea causes sleep disruption, nonrestorative sleep, and sleep deprivation. This can have an impact on your day to day functioning and cause daytime sleepiness and impairment of cognitive function, memory loss, mood disturbance, and problems focussing. Using CPAP regularly can improve these symptoms.  Keep up the good work! I will see you back in 12 months for sleep apnea check up.   Please look into using a salt water nasal rinse system, such as the [employer] and follow the directions and my instructions. It may help your nasal congestion and help you tolerate your CPAP.   You can try over the counter flonase for a few weeks as well.   Try to lose weight and drink more water, less caffeine!

## 2015-08-06 NOTE — Progress Notes (Signed)
  Medical screening examination/treatment/procedure(s) were performed by non-physician practitioner and as supervising physician I was immediately available for consultation/collaboration.     

## 2015-08-19 ENCOUNTER — Other Ambulatory Visit: Payer: Self-pay | Admitting: Physician Assistant

## 2015-08-22 NOTE — Telephone Encounter (Signed)
Spoke with patient going to schedule an appointment for further refills.

## 2015-11-18 ENCOUNTER — Other Ambulatory Visit: Payer: Self-pay | Admitting: Physician Assistant

## 2015-11-20 NOTE — Telephone Encounter (Signed)
Spoke with patient he is coming in for appointment and will get refill then

## 2016-01-16 ENCOUNTER — Ambulatory Visit (INDEPENDENT_AMBULATORY_CARE_PROVIDER_SITE_OTHER): Admitting: Physician Assistant

## 2016-01-16 ENCOUNTER — Encounter: Payer: Self-pay | Admitting: Physician Assistant

## 2016-01-16 VITALS — BP 128/86 | HR 70 | Temp 97.8°F | Resp 16 | Ht 70.75 in | Wt 213.0 lb

## 2016-01-16 DIAGNOSIS — G90521 Complex regional pain syndrome I of right lower limb: Secondary | ICD-10-CM | POA: Diagnosis not present

## 2016-01-16 DIAGNOSIS — K219 Gastro-esophageal reflux disease without esophagitis: Secondary | ICD-10-CM | POA: Diagnosis not present

## 2016-01-16 DIAGNOSIS — E785 Hyperlipidemia, unspecified: Secondary | ICD-10-CM

## 2016-01-16 DIAGNOSIS — G4733 Obstructive sleep apnea (adult) (pediatric): Secondary | ICD-10-CM | POA: Diagnosis not present

## 2016-01-16 DIAGNOSIS — Z0000 Encounter for general adult medical examination without abnormal findings: Secondary | ICD-10-CM

## 2016-01-16 DIAGNOSIS — Z114 Encounter for screening for human immunodeficiency virus [HIV]: Secondary | ICD-10-CM | POA: Diagnosis not present

## 2016-01-16 DIAGNOSIS — Z1159 Encounter for screening for other viral diseases: Secondary | ICD-10-CM | POA: Diagnosis not present

## 2016-01-16 DIAGNOSIS — Z9989 Dependence on other enabling machines and devices: Secondary | ICD-10-CM

## 2016-01-16 DIAGNOSIS — H9319 Tinnitus, unspecified ear: Secondary | ICD-10-CM | POA: Insufficient documentation

## 2016-01-16 DIAGNOSIS — D751 Secondary polycythemia: Secondary | ICD-10-CM | POA: Diagnosis not present

## 2016-01-16 DIAGNOSIS — Z1329 Encounter for screening for other suspected endocrine disorder: Secondary | ICD-10-CM | POA: Diagnosis not present

## 2016-01-16 DIAGNOSIS — H9313 Tinnitus, bilateral: Secondary | ICD-10-CM

## 2016-01-16 DIAGNOSIS — I872 Venous insufficiency (chronic) (peripheral): Secondary | ICD-10-CM

## 2016-01-16 DIAGNOSIS — M549 Dorsalgia, unspecified: Secondary | ICD-10-CM

## 2016-01-16 DIAGNOSIS — F329 Major depressive disorder, single episode, unspecified: Secondary | ICD-10-CM

## 2016-01-16 DIAGNOSIS — F32A Depression, unspecified: Secondary | ICD-10-CM

## 2016-01-16 DIAGNOSIS — G8929 Other chronic pain: Secondary | ICD-10-CM

## 2016-01-16 LAB — COMPREHENSIVE METABOLIC PANEL
ALT: 15 U/L (ref 9–46)
AST: 13 U/L (ref 10–35)
Albumin: 4.2 g/dL (ref 3.6–5.1)
Alkaline Phosphatase: 74 U/L (ref 40–115)
BUN: 9 mg/dL (ref 7–25)
CO2: 29 mmol/L (ref 20–31)
Calcium: 8.9 mg/dL (ref 8.6–10.3)
Chloride: 105 mmol/L (ref 98–110)
Creat: 0.88 mg/dL (ref 0.70–1.33)
Glucose, Bld: 105 mg/dL — ABNORMAL HIGH (ref 65–99)
Potassium: 4.4 mmol/L (ref 3.5–5.3)
Sodium: 143 mmol/L (ref 135–146)
Total Bilirubin: 0.7 mg/dL (ref 0.2–1.2)
Total Protein: 6.4 g/dL (ref 6.1–8.1)

## 2016-01-16 LAB — CBC WITH DIFFERENTIAL/PLATELET
Basophils Absolute: 79 cells/uL (ref 0–200)
Basophils Relative: 1 %
Eosinophils Absolute: 158 cells/uL (ref 15–500)
Eosinophils Relative: 2 %
HCT: 48.1 % (ref 38.5–50.0)
Hemoglobin: 17 g/dL (ref 13.2–17.1)
Lymphocytes Relative: 35 %
Lymphs Abs: 2765 cells/uL (ref 850–3900)
MCH: 32.6 pg (ref 27.0–33.0)
MCHC: 35.3 g/dL (ref 32.0–36.0)
MCV: 92.1 fL (ref 80.0–100.0)
MPV: 9.6 fL (ref 7.5–12.5)
Monocytes Absolute: 395 cells/uL (ref 200–950)
Monocytes Relative: 5 %
Neutro Abs: 4503 cells/uL (ref 1500–7800)
Neutrophils Relative %: 57 %
Platelets: 156 10*3/uL (ref 140–400)
RBC: 5.22 MIL/uL (ref 4.20–5.80)
RDW: 14.3 % (ref 11.0–15.0)
WBC: 7.9 10*3/uL (ref 3.8–10.8)

## 2016-01-16 LAB — LIPID PANEL
Cholesterol: 164 mg/dL (ref 125–200)
HDL: 40 mg/dL (ref >=40)
LDL Cholesterol: 107 mg/dL (ref <130)
Total CHOL/HDL Ratio: 4.1 Ratio (ref <=5.0)
Triglycerides: 87 mg/dL (ref <150)
VLDL: 17 mg/dL (ref <30)

## 2016-01-16 LAB — TSH: TSH: 1.24 mIU/L (ref 0.40–4.50)

## 2016-01-16 MED ORDER — NIACIN ER (ANTIHYPERLIPIDEMIC) 500 MG PO TBCR: EXTENDED_RELEASE_TABLET | ORAL | Status: DC

## 2016-01-16 MED ORDER — BUPROPION HCL ER (XL) 300 MG PO TB24: 300.0000 mg | ORAL_TABLET | Freq: Every morning | ORAL | Status: DC

## 2016-01-16 MED ORDER — OMEPRAZOLE 20 MG PO CPDR: DELAYED_RELEASE_CAPSULE | ORAL | Status: DC

## 2016-01-16 NOTE — Patient Instructions (Signed)
     IF you received an x-ray today, you will receive an invoice from [facility]. Please contact [facility] at [phone] with questions or concerns regarding your invoice.   IF you received labwork today, you will receive an invoice from [facility]. Please contact [facility] at [phone] with questions or concerns regarding your invoice.   Our billing staff will not be able to assist you with questions regarding bills from these companies.  You will be contacted with the lab results as soon as they are available. The fastest way to get your results is to activate your My Chart account. Instructions are located on the last page of this paperwork. If you have not heard from us regarding the results in 2 weeks, please contact this office.      

## 2016-01-16 NOTE — Progress Notes (Signed)
   Subjective:    Patient ID: Ralph [name], male    DOB: January 20, 1961, 55 y.o.   MRN: [id number]  HPI    Review of Systems  Constitutional: Negative.   HENT: Positive for tinnitus.   Eyes: Negative.   Respiratory: Negative.   Cardiovascular: Negative.   Gastrointestinal: Negative.   Endocrine: Negative.   Genitourinary: Negative.   Musculoskeletal: Positive for back pain.  Skin: Negative.   Allergic/Immunologic: Negative.   Neurological: Negative.   Hematological: Bruises/bleeds easily.  Psychiatric/Behavioral: Negative.        Objective:   Physical Exam        Assessment & Plan:
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
## 2016-01-17 LAB — HEPATITIS C ANTIBODY: HCV Ab: NEGATIVE

## 2016-01-17 LAB — HIV ANTIBODY (ROUTINE TESTING W REFLEX): HIV 1&2 Ab, 4th Generation: NONREACTIVE

## 2016-01-19 DIAGNOSIS — G90521 Complex regional pain syndrome I of right lower limb: Secondary | ICD-10-CM | POA: Insufficient documentation

## 2016-01-20 ENCOUNTER — Encounter: Payer: Self-pay | Admitting: Physician Assistant

## 2016-01-24 ENCOUNTER — Other Ambulatory Visit: Payer: Self-pay | Admitting: *Deleted

## 2016-01-24 DIAGNOSIS — L819 Disorder of pigmentation, unspecified: Secondary | ICD-10-CM

## 2016-01-24 DIAGNOSIS — I872 Venous insufficiency (chronic) (peripheral): Secondary | ICD-10-CM

## 2016-01-24 DIAGNOSIS — R0989 Other specified symptoms and signs involving the circulatory and respiratory systems: Secondary | ICD-10-CM

## 2016-01-31 ENCOUNTER — Encounter: Payer: Self-pay | Admitting: Vascular Surgery

## 2016-02-07 ENCOUNTER — Ambulatory Visit (INDEPENDENT_AMBULATORY_CARE_PROVIDER_SITE_OTHER): Admitting: Vascular Surgery

## 2016-02-07 ENCOUNTER — Encounter: Payer: Self-pay | Admitting: Vascular Surgery

## 2016-02-07 ENCOUNTER — Ambulatory Visit (HOSPITAL_COMMUNITY)
Admission: RE | Admit: 2016-02-07 | Discharge: 2016-02-07 | Disposition: A | Discharge status: Home / Self Care | Source: Ambulatory Visit | Attending: Vascular Surgery | Admitting: Vascular Surgery

## 2016-02-07 VITALS — BP 131/88 | HR 74 | Temp 97.0°F | Resp 18 | Ht 70.0 in | Wt 210.0 lb

## 2016-02-07 DIAGNOSIS — L819 Disorder of pigmentation, unspecified: Secondary | ICD-10-CM | POA: Diagnosis not present

## 2016-02-07 DIAGNOSIS — I872 Venous insufficiency (chronic) (peripheral): Secondary | ICD-10-CM | POA: Insufficient documentation

## 2016-02-07 DIAGNOSIS — M21371 Foot drop, right foot: Secondary | ICD-10-CM | POA: Diagnosis not present

## 2016-02-07 DIAGNOSIS — E7800 Pure hypercholesterolemia, unspecified: Secondary | ICD-10-CM | POA: Diagnosis not present

## 2016-02-07 DIAGNOSIS — R0989 Other specified symptoms and signs involving the circulatory and respiratory systems: Secondary | ICD-10-CM | POA: Diagnosis not present

## 2016-02-07 NOTE — Addendum Note (Signed)
Addended by: Mena Goes on: 02/07/2016 05:11 PM   Modules accepted: Orders
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
## 2016-02-15 ENCOUNTER — Ambulatory Visit (HOSPITAL_COMMUNITY)
Admission: RE | Admit: 2016-02-15 | Discharge: 2016-02-15 | Disposition: A | Discharge status: Home / Self Care | Source: Ambulatory Visit | Attending: Vascular Surgery | Admitting: Vascular Surgery

## 2016-02-15 DIAGNOSIS — M21371 Foot drop, right foot: Secondary | ICD-10-CM | POA: Diagnosis not present

## 2016-02-15 DIAGNOSIS — R609 Edema, unspecified: Secondary | ICD-10-CM | POA: Diagnosis present

## 2016-02-15 DIAGNOSIS — I8393 Asymptomatic varicose veins of bilateral lower extremities: Secondary | ICD-10-CM | POA: Diagnosis not present

## 2016-02-15 DIAGNOSIS — E7800 Pure hypercholesterolemia, unspecified: Secondary | ICD-10-CM | POA: Diagnosis not present

## 2016-02-15 DIAGNOSIS — I872 Venous insufficiency (chronic) (peripheral): Secondary | ICD-10-CM

## 2016-02-16 ENCOUNTER — Encounter: Payer: Self-pay | Admitting: Vascular Surgery

## 2016-02-23 ENCOUNTER — Encounter: Payer: Self-pay | Admitting: Vascular Surgery

## 2016-02-23 ENCOUNTER — Ambulatory Visit (INDEPENDENT_AMBULATORY_CARE_PROVIDER_SITE_OTHER): Admitting: Vascular Surgery

## 2016-02-23 VITALS — BP 129/86 | HR 75 | Ht 70.0 in | Wt 212.1 lb

## 2016-02-23 DIAGNOSIS — I872 Venous insufficiency (chronic) (peripheral): Secondary | ICD-10-CM

## 2016-02-23 NOTE — Progress Notes (Signed)
    Established Venous Insufficiency   History of Present Illness  Ralph [name] is a 55 y.o. (04-07-1961) male who presents with chief complaint: follow for vascular studies.  The patient's symptoms have not progressed.  The patient's symptoms are: bluish foot without intermittent claudication.  The patient is not compliant with compression stockings.  The patient's PMH, PSH, SH, and FamHx are unchanged from 02/07/16.  Current Outpatient Prescriptions  Medication Sig Dispense Refill  . aspirin 325 MG EC tablet Take 325 mg by mouth daily.    Marland Kitchen buPROPion (WELLBUTRIN XL) 300 MG 24 hr tablet Take 1 tablet (300 mg total) by mouth every morning. 90 tablet 3  . cholecalciferol (VITAMIN D) 1000 UNITS tablet Take 2,000 Units by mouth daily.    . niacin (NIASPAN) 500 MG CR tablet TAKE 1 TABLET AT BEDTIME 90 tablet 3  . omeprazole (PRILOSEC) 20 MG capsule TAKE 1 CAPSULE DAILY 90 capsule 3  . polyethylene glycol powder (GLYCOLAX/MIRALAX) powder Take 17 g by mouth daily. 1500 g 3   No current facility-administered medications for this visit.    Allergies  Allergen Reactions  . Antihistamine [Diphenhydramine Hcl] Other (See Comments)    nightmares  . Hydrocodone Rash  . Penicillins Rash    On ROS today: no rest pain, no wounds or gangrene, no intermittent claudication    Physical Examination  Filed Vitals:   02/23/16 0922  BP: 129/86  Pulse: 75  Height: 5\' 10"  (1.778 m)  Weight: 212 lb 1.6 oz (96.208 kg)  SpO2: 94%   Body mass index is 30.43 kg/(m^2).  General: A&O x 3, WD, mildly obese,   Pulmonary: Sym exp, good air movt, CTAB, no rales, rhonchi, & wheezing  Cardiac: RRR, Nl S1, S2, no Murmurs, rubs or gallops  Vascular: Vessel Right Left  Radial Palpable Palpable  Brachial Palpable Palpable  Carotid Palpable, without bruit Palpable, without bruit  Aorta Not palpable N/A  Femoral Palpable Palpable  Popliteal Not palpable Not palpable  PT Palpable Palpable  DP Palpable  Palpable   Gastrointestinal: soft, NTND, no G/R, no HSM, - masses, no CVAT B  Musculoskeletal: M/S 5/5 throughout , Extremities without ischemic changes , cyanotic right foot  Neurologic: Pain and light touch intact in extremities , Motor exam as listed above   Non-Invasive Vascular Imaging  BLE Venous Insufficiency Duplex (Date: 02/15/16):   RLE:   no DVT and SVT,   minimal GSV reflux,   no SSV reflux,  no deep venous reflux  LLE:  no DVT and SVT,   minimal GSV reflux,   no SSV reflux,  + deep venous reflux: CFV   Medical Decision Making  Ralph [name] is a 55 y.o. male who presents with: R foot likely due to chronic venous insufficiency (C2)   Surprisingly the right foot has minimal CVI.  He has intact arterial supply in this foot, so he has no evidence of arterial insufficiency as the etiology for his cyanotic appearance.  At this point, no further intervention is needed other then OTC compression stockings if he develops any swelling in his legs.  Thank you for allowing [place] to participate in this patient's care.   Adele Barthel, MD [facility] Office: [phone] Pager: [phone]  02/23/2016, 9:11 AM

## 2016-03-16 ENCOUNTER — Other Ambulatory Visit: Payer: Self-pay | Admitting: Anesthesiology

## 2016-04-03 NOTE — Pre-Procedure Instructions (Signed)
Ralph [name]  04/03/2016      [facility] - [place], [place] [address] [place] [address] Phone: [phone] Fax: [phone]  [id number] [place], [place] - [place] [id number] [place] [id number] Phone: [phone] Fax: [phone]    Your procedure is scheduled on Fri, June 30 @ 7:30 AM  Report to [facility] at 5:30 AM  Call this number if you have problems the morning of surgery:  [phone]   Remember:  Do not eat food or drink liquids after midnight.  Take these medicines the morning of surgery with A SIP OF WATER Wellbutrin(Bupropion) and Omeprazole(Prilosec)               Stop taking your Aspirin. No Goody's,BC's,Aleve,Advil,Motrin,Ibuprofen,Fish Oil,or any Herbal Medications.    Do not wear jewelry.  Do not wear lotions, powders, or colognes.  You may wear deoderant.             Men may shave face and neck.  Do not bring valuables to the hospital.  [facility] is not responsible for any belongings or valuables.  Contacts, dentures or bridgework may not be worn into surgery.  Leave your suitcase in the car.  After surgery it may be brought to your room.  For patients admitted to the hospital, discharge time will be determined by your treatment team.  Patients discharged the day of surgery will not be allowed to drive home.    Special instructions:  [place] - Preparing for Surgery  Before surgery, you can play an important role.  Because skin is not sterile, your skin needs to be as free of germs as possible.  You can reduce the number of germs on you skin by washing with CHG (chlorahexidine gluconate) soap before surgery.  CHG is an antiseptic cleaner which kills germs and bonds with the skin to continue killing germs even after washing.  Please DO NOT use if you have an allergy to CHG or antibacterial soaps.  If your skin becomes  reddened/irritated stop using the CHG and inform your nurse when you arrive at Short Stay.  Do not shave (including legs and underarms) for at least 48 hours prior to the first CHG shower.  You may shave your face.  Please follow these instructions carefully:   1.  Shower with CHG Soap the night before surgery and the                                morning of Surgery.  2.  If you choose to wash your hair, wash your hair first as usual with your       normal shampoo.  3.  After you shampoo, rinse your hair and body thoroughly to remove the                      Shampoo.  4.  Use CHG as you would any other liquid soap.  You can apply chg directly       to the skin and wash gently with scrungie or a clean washcloth.  5.  Apply the CHG Soap to your body ONLY FROM THE NECK DOWN.        Do not use on open wounds or open sores.  Avoid contact with your eyes,  ears, mouth and genitals (private parts).  Wash genitals (private parts)       with your normal soap.  6.  Wash thoroughly, paying special attention to the area where your surgery        will be performed.  7.  Thoroughly rinse your body with warm water from the neck down.  8.  DO NOT shower/wash with your normal soap after using and rinsing off       the CHG Soap.  9.  Pat yourself dry with a clean towel.            10.  Wear clean pajamas.            11.  Place clean sheets on your bed the night of your first shower and do not        sleep with pets.  Day of Surgery  Do not apply any lotions/deoderants the morning of surgery.  Please wear clean clothes to the hospital/surgery center.    Please read over the following fact sheets that you were given. Pain Booklet and MRSA Information

## 2016-04-04 ENCOUNTER — Other Ambulatory Visit: Payer: Self-pay

## 2016-04-04 ENCOUNTER — Encounter (HOSPITAL_COMMUNITY): Payer: Self-pay

## 2016-04-04 ENCOUNTER — Encounter (HOSPITAL_COMMUNITY)
Admission: RE | Admit: 2016-04-04 | Discharge: 2016-04-04 | Disposition: A | Discharge status: Home / Self Care | Source: Ambulatory Visit | Attending: Anesthesiology | Admitting: Anesthesiology

## 2016-04-04 DIAGNOSIS — Z01812 Encounter for preprocedural laboratory examination: Secondary | ICD-10-CM | POA: Diagnosis not present

## 2016-04-04 DIAGNOSIS — M5126 Other intervertebral disc displacement, lumbar region: Secondary | ICD-10-CM | POA: Insufficient documentation

## 2016-04-04 DIAGNOSIS — Z01818 Encounter for other preprocedural examination: Secondary | ICD-10-CM | POA: Diagnosis present

## 2016-04-04 HISTORY — DX: Personal history of other venous thrombosis and embolism: Z86.718

## 2016-04-04 HISTORY — DX: Personal history of colon polyps, unspecified: Z86.0100

## 2016-04-04 HISTORY — DX: Gastro-esophageal reflux disease without esophagitis: K21.9

## 2016-04-04 HISTORY — DX: Polyneuropathy, unspecified: G62.9

## 2016-04-04 HISTORY — DX: Foot drop, right foot: M21.371

## 2016-04-04 HISTORY — DX: Hyperlipidemia, unspecified: E78.5

## 2016-04-04 HISTORY — DX: Pneumonia, unspecified organism: J18.9

## 2016-04-04 HISTORY — DX: Constipation, unspecified: K59.00

## 2016-04-04 HISTORY — DX: Unspecified osteoarthritis, unspecified site: M19.90

## 2016-04-04 HISTORY — DX: Personal history of colonic polyps: Z86.010

## 2016-04-04 LAB — BASIC METABOLIC PANEL
Anion gap: 5 (ref 5–15)
BUN: 7 mg/dL (ref 6–20)
CO2: 30 mmol/L (ref 22–32)
Calcium: 9.4 mg/dL (ref 8.9–10.3)
Chloride: 103 mmol/L (ref 101–111)
Creatinine, Ser: 1.13 mg/dL (ref 0.61–1.24)
GFR calc Af Amer: >60 mL/min (ref >60)
GFR calc non Af Amer: >60 mL/min (ref >60)
Glucose, Bld: 94 mg/dL (ref 65–99)
Potassium: 4.7 mmol/L (ref 3.5–5.1)
Sodium: 138 mmol/L (ref 135–145)

## 2016-04-04 LAB — CBC
HCT: 49.5 % (ref 39.0–52.0)
Hemoglobin: 17.1 g/dL — ABNORMAL HIGH (ref 13.0–17.0)
MCH: 32.3 pg (ref 26.0–34.0)
MCHC: 34.5 g/dL (ref 30.0–36.0)
MCV: 93.4 fL (ref 78.0–100.0)
Platelets: 153 10*3/uL (ref 150–400)
RBC: 5.3 MIL/uL (ref 4.22–5.81)
RDW: 13.9 % (ref 11.5–15.5)
WBC: 9.5 10*3/uL (ref 4.0–10.5)

## 2016-04-04 LAB — SURGICAL PCR SCREEN
MRSA, PCR: NEGATIVE
Staphylococcus aureus: NEGATIVE

## 2016-04-04 NOTE — Progress Notes (Addendum)
Saw a cardiologist > 5 yrs ago d/t chest tightness.Was told everything looked good and no further follow up needed  Goes to Urgent [facility] on [address],sees PA Chelle Jeffery  Echo denies  Stress test > 5 yrs ago  Heart cath denies  EKG denies in past yr  CXR denies in past yr  Sleep study in epic from 2016
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
## 2016-04-11 MED ORDER — VANCOMYCIN HCL IN DEXTROSE 1-5 GM/200ML-% IV SOLN
1000.0000 mg | INTRAVENOUS | Status: DC
  Filled 2016-04-11: qty 200

## 2016-04-12 ENCOUNTER — Encounter (HOSPITAL_COMMUNITY)
Admission: RE | Disposition: A | Discharge status: Home / Self Care | Payer: Self-pay | Source: Ambulatory Visit | Attending: Anesthesiology

## 2016-04-12 ENCOUNTER — Ambulatory Visit (HOSPITAL_COMMUNITY)
Admission: RE | Admit: 2016-04-12 | Discharge: 2016-04-12 | Disposition: A | Discharge status: Home / Self Care | Source: Ambulatory Visit | Attending: Anesthesiology | Admitting: Anesthesiology

## 2016-04-12 ENCOUNTER — Ambulatory Visit (HOSPITAL_COMMUNITY)

## 2016-04-12 ENCOUNTER — Ambulatory Visit (HOSPITAL_COMMUNITY): Admitting: Anesthesiology

## 2016-04-12 ENCOUNTER — Encounter (HOSPITAL_COMMUNITY): Payer: Self-pay | Admitting: Surgery

## 2016-04-12 DIAGNOSIS — G4730 Sleep apnea, unspecified: Secondary | ICD-10-CM | POA: Insufficient documentation

## 2016-04-12 DIAGNOSIS — K5900 Constipation, unspecified: Secondary | ICD-10-CM | POA: Insufficient documentation

## 2016-04-12 DIAGNOSIS — F329 Major depressive disorder, single episode, unspecified: Secondary | ICD-10-CM | POA: Insufficient documentation

## 2016-04-12 DIAGNOSIS — Z79899 Other long term (current) drug therapy: Secondary | ICD-10-CM | POA: Insufficient documentation

## 2016-04-12 DIAGNOSIS — E785 Hyperlipidemia, unspecified: Secondary | ICD-10-CM | POA: Insufficient documentation

## 2016-04-12 DIAGNOSIS — Z7982 Long term (current) use of aspirin: Secondary | ICD-10-CM | POA: Insufficient documentation

## 2016-04-12 DIAGNOSIS — Z87891 Personal history of nicotine dependence: Secondary | ICD-10-CM | POA: Diagnosis not present

## 2016-04-12 DIAGNOSIS — G8929 Other chronic pain: Secondary | ICD-10-CM | POA: Diagnosis present

## 2016-04-12 DIAGNOSIS — G8918 Other acute postprocedural pain: Secondary | ICD-10-CM | POA: Diagnosis not present

## 2016-04-12 DIAGNOSIS — Q7649 Other congenital malformations of spine, not associated with scoliosis: Secondary | ICD-10-CM | POA: Diagnosis not present

## 2016-04-12 DIAGNOSIS — Z419 Encounter for procedure for purposes other than remedying health state, unspecified: Secondary | ICD-10-CM

## 2016-04-12 DIAGNOSIS — M5116 Intervertebral disc disorders with radiculopathy, lumbar region: Secondary | ICD-10-CM | POA: Diagnosis not present

## 2016-04-12 DIAGNOSIS — K219 Gastro-esophageal reflux disease without esophagitis: Secondary | ICD-10-CM | POA: Insufficient documentation

## 2016-04-12 DIAGNOSIS — G629 Polyneuropathy, unspecified: Secondary | ICD-10-CM | POA: Diagnosis not present

## 2016-04-12 HISTORY — PX: SPINAL CORD STIMULATOR INSERTION: SHX5378

## 2016-04-12 SURGERY — INSERTION, SPINAL CORD STIMULATOR, LUMBAR
Anesthesia: Monitor Anesthesia Care

## 2016-04-12 MED ORDER — LACTATED RINGERS IV SOLN: INTRAVENOUS | Status: DC

## 2016-04-12 MED ORDER — VANCOMYCIN HCL 1000 MG IV SOLR
1000.0000 mg | INTRAVENOUS | Status: DC | PRN
  Administered 2016-04-12: 1000 mg via INTRAVENOUS

## 2016-04-12 MED ORDER — FENTANYL CITRATE (PF) 250 MCG/5ML IJ SOLN
INTRAMUSCULAR | Status: AC
  Filled 2016-04-12: qty 5

## 2016-04-12 MED ORDER — METOCLOPRAMIDE HCL 5 MG/ML IJ SOLN: 10.0000 mg | Freq: Once | INTRAMUSCULAR | Status: DC | PRN

## 2016-04-12 MED ORDER — PROPOFOL 500 MG/50ML IV EMUL
INTRAVENOUS | Status: DC | PRN
  Administered 2016-04-12: 100 ug/kg/min via INTRAVENOUS

## 2016-04-12 MED ORDER — EPHEDRINE 5 MG/ML INJ
INTRAVENOUS | Status: AC
  Filled 2016-04-12: qty 10

## 2016-04-12 MED ORDER — SUCCINYLCHOLINE CHLORIDE 200 MG/10ML IV SOSY
PREFILLED_SYRINGE | INTRAVENOUS | Status: AC
  Filled 2016-04-12: qty 10

## 2016-04-12 MED ORDER — BACITRACIN-NEOMYCIN-POLYMYXIN OINTMENT TUBE
TOPICAL_OINTMENT | CUTANEOUS | Status: DC | PRN
  Administered 2016-04-12: 1 via TOPICAL

## 2016-04-12 MED ORDER — SODIUM CHLORIDE 0.9 % IR SOLN
Status: DC | PRN
  Administered 2016-04-12: 07:00:00

## 2016-04-12 MED ORDER — ROCURONIUM BROMIDE 50 MG/5ML IV SOLN
INTRAVENOUS | Status: AC
  Filled 2016-04-12: qty 1

## 2016-04-12 MED ORDER — ONDANSETRON HCL 4 MG/2ML IJ SOLN
INTRAMUSCULAR | Status: DC | PRN
  Administered 2016-04-12: 4 mg via INTRAVENOUS

## 2016-04-12 MED ORDER — MIDAZOLAM HCL 2 MG/2ML IJ SOLN
INTRAMUSCULAR | Status: AC
  Filled 2016-04-12: qty 2

## 2016-04-12 MED ORDER — MIDAZOLAM HCL 5 MG/5ML IJ SOLN
INTRAMUSCULAR | Status: DC | PRN
  Administered 2016-04-12: 1 mg via INTRAVENOUS

## 2016-04-12 MED ORDER — BUPIVACAINE-EPINEPHRINE (PF) 0.5% -1:200000 IJ SOLN
INTRAMUSCULAR | Status: DC | PRN
  Administered 2016-04-12: 30 mL via PERINEURAL

## 2016-04-12 MED ORDER — PROPOFOL 10 MG/ML IV BOLUS
INTRAVENOUS | Status: AC
  Filled 2016-04-12: qty 20

## 2016-04-12 MED ORDER — OXYCODONE-ACETAMINOPHEN 10-325 MG PO TABS: 1.0000 | ORAL_TABLET | ORAL | Status: DC | PRN

## 2016-04-12 MED ORDER — FENTANYL CITRATE (PF) 100 MCG/2ML IJ SOLN
INTRAMUSCULAR | Status: DC | PRN
  Administered 2016-04-12 (×2): 25 ug via INTRAVENOUS
  Administered 2016-04-12: 50 ug via INTRAVENOUS

## 2016-04-12 MED ORDER — MEPERIDINE HCL 25 MG/ML IJ SOLN: 6.2500 mg | INTRAMUSCULAR | Status: DC | PRN

## 2016-04-12 MED ORDER — 0.9 % SODIUM CHLORIDE (POUR BTL) OPTIME
TOPICAL | Status: DC | PRN
  Administered 2016-04-12: 1000 mL

## 2016-04-12 MED ORDER — LACTATED RINGERS IV SOLN
INTRAVENOUS | Status: DC | PRN
  Administered 2016-04-12: 07:00:00 via INTRAVENOUS

## 2016-04-12 MED ORDER — ONDANSETRON HCL 4 MG/2ML IJ SOLN
INTRAMUSCULAR | Status: AC
  Filled 2016-04-12: qty 2

## 2016-04-12 MED ORDER — FENTANYL CITRATE (PF) 100 MCG/2ML IJ SOLN: 25.0000 ug | INTRAMUSCULAR | Status: DC | PRN

## 2016-04-12 SURGICAL SUPPLY — 65 items
ANCH LD 4 SETX2 CLIK X (Stimulator) ×1 IMPLANT
ANCHOR CLIK X NEURO (Stimulator) ×2 IMPLANT
APL SKNCLS STERI-STRIP NONHPOA (GAUZE/BANDAGES/DRESSINGS)
BAG DECANTER FOR FLEXI CONT (MISCELLANEOUS) ×2 IMPLANT
BENZOIN TINCTURE PRP APPL 2/3 (GAUZE/BANDAGES/DRESSINGS) IMPLANT
BINDER ABDOMINAL 12 ML 46-62 (SOFTGOODS) ×2 IMPLANT
BLADE CLIPPER SURG (BLADE) IMPLANT
CABLE OR STIMULATOR 2X8 61 (WIRE) ×2 IMPLANT
CHLORAPREP W/TINT 26ML (MISCELLANEOUS) ×2 IMPLANT
CLIP TI WIDE RED SMALL 6 (CLIP) IMPLANT
DRAPE C-ARM 42X72 X-RAY (DRAPES) ×2 IMPLANT
DRAPE C-ARMOR (DRAPES) ×2 IMPLANT
DRAPE LAPAROTOMY 100X72X124 (DRAPES) ×2 IMPLANT
DRAPE POUCH INSTRU U-SHP 10X18 (DRAPES) ×2 IMPLANT
DRAPE SURG 17X23 STRL (DRAPES) ×2 IMPLANT
DRSG OPSITE POSTOP 3X4 (GAUZE/BANDAGES/DRESSINGS) ×1 IMPLANT
DRSG OPSITE POSTOP 4X6 (GAUZE/BANDAGES/DRESSINGS) ×1 IMPLANT
ELECT REM PT RETURN 9FT ADLT (ELECTROSURGICAL) ×2
ELECTRODE REM PT RTRN 9FT ADLT (ELECTROSURGICAL) ×1 IMPLANT
GAUZE SPONGE 4X4 16PLY XRAY LF (GAUZE/BANDAGES/DRESSINGS) ×2 IMPLANT
GLOVE BIOGEL PI IND STRL 7.5 (GLOVE) ×1 IMPLANT
GLOVE BIOGEL PI INDICATOR 7.5 (GLOVE) ×1
GLOVE ECLIPSE 7.5 STRL STRAW (GLOVE) ×2 IMPLANT
GLOVE EXAM NITRILE LRG STRL (GLOVE) IMPLANT
GLOVE EXAM NITRILE MD LF STRL (GLOVE) IMPLANT
GLOVE EXAM NITRILE XL STR (GLOVE) IMPLANT
GLOVE EXAM NITRILE XS STR PU (GLOVE) IMPLANT
GOWN STRL REUS W/ TWL LRG LVL3 (GOWN DISPOSABLE) IMPLANT
GOWN STRL REUS W/ TWL XL LVL3 (GOWN DISPOSABLE) IMPLANT
GOWN STRL REUS W/TWL 2XL LVL3 (GOWN DISPOSABLE) IMPLANT
GOWN STRL REUS W/TWL LRG LVL3 (GOWN DISPOSABLE)
GOWN STRL REUS W/TWL XL LVL3 (GOWN DISPOSABLE)
IPG PRECISION SPECTRA (Stimulator) ×2 IMPLANT
KIT BASIN OR (CUSTOM PROCEDURE TRAY) ×2 IMPLANT
KIT CHARGING (KITS) ×1
KIT CHARGING PRECISION NEURO (KITS) IMPLANT
KIT PAT PROGRAM FREELINK (KITS) IMPLANT
KIT ROOM TURNOVER OR (KITS) ×2 IMPLANT
LEAD INFINION CX PERC 70CM (Cable) ×2 IMPLANT
LIQUID BAND (GAUZE/BANDAGES/DRESSINGS) ×2 IMPLANT
NDL ENTRADA 4.5IN (NEEDLE) IMPLANT
NEEDLE 18GX1X1/2 (RX/OR ONLY) (NEEDLE) IMPLANT
NEEDLE ENTRADA 4.5IN (NEEDLE) ×4 IMPLANT
NEEDLE HYPO 25X1 1.5 SAFETY (NEEDLE) ×2 IMPLANT
NS IRRIG 1000ML POUR BTL (IV SOLUTION) ×2 IMPLANT
PACK LAMINECTOMY NEURO (CUSTOM PROCEDURE TRAY) ×2 IMPLANT
PAD ARMBOARD 7.5X6 YLW CONV (MISCELLANEOUS) ×2 IMPLANT
REMOTE CONTROL KIT (KITS) ×2
SPONGE LAP 4X18 X RAY DECT (DISPOSABLE) ×2 IMPLANT
SPONGE SURGIFOAM ABS GEL SZ50 (HEMOSTASIS) IMPLANT
STAPLER SKIN PROX WIDE 3.9 (STAPLE) ×2 IMPLANT
STRIP CLOSURE SKIN 1/2X4 (GAUZE/BANDAGES/DRESSINGS) IMPLANT
SUT MNCRL AB 4-0 PS2 18 (SUTURE) IMPLANT
SUT SILK 0 (SUTURE) ×2
SUT SILK 0 MO-6 18XCR BRD 8 (SUTURE) ×1 IMPLANT
SUT SILK 0 TIES 10X30 (SUTURE) IMPLANT
SUT SILK 2 0 TIES 10X30 (SUTURE) IMPLANT
SUT VIC AB 2-0 CP2 18 (SUTURE) ×4 IMPLANT
SYR EPIDURAL 5ML GLASS (SYRINGE) ×2 IMPLANT
SYRINGE 10CC LL (SYRINGE) IMPLANT
TOOL LONG TUNNEL (SPINAL CORD STIMULATOR) ×1 IMPLANT
TOWEL OR 17X24 6PK STRL BLUE (TOWEL DISPOSABLE) ×2 IMPLANT
TOWEL OR 17X26 10 PK STRL BLUE (TOWEL DISPOSABLE) ×2 IMPLANT
WATER STERILE IRR 1000ML POUR (IV SOLUTION) ×2 IMPLANT
YANKAUER SUCT BULB TIP NO VENT (SUCTIONS) ×2 IMPLANT

## 2016-04-12 NOTE — Discharge Instructions (Signed)
Dr. Alberto Schoch Post-Op Orders ° °• Ice Pack - 20 minutes on (in a pillow case), and 20 minutes off. Wear the ice pack UNDER the binder. °• Follow up in office, they will call you for an appointment in 10 days to 2 weeks. °• Increase activity gradually.   °• No lifting anything heavier than a gallon of milk (10 pounds) until seen in the office. °• Advance diet slowly as tolerated. °• Dressing care:  Keep dressing dry for 3 days, and on Post-op day 4, may shower. °• Call for fever, drainage, and redness. °• No swimming or bathing in a bathtub (do not get into standing water). °•  °

## 2016-04-12 NOTE — Op Note (Signed)
PREOP DX: 1) lumbago  2) lumbar radiculopathy  3) post-0surgical/post-traumatic pain  4) chronic pain  POSTOP DX: same as preop   PROCEDURES PERFORMED:1) intraop fluoro 2) placement of 2 16 contact [employer] Infinion leads 3) placement of Spectra SCS generator  SURGEON:Daijah Scrivens  ASSISTANT: NONE  ANESTHESIA: MAC  EBL: <20cc  DESCRIPTION OF PROCEDURE: After a discussion of risks, benefits and alternatives, informed consent was obtained. The patient was taken to the OR, turned prone onto a Jackson table, all pressure points padded, SCD's placed, and an adequate plane of anesthesia induced. A timeout was taken to verify the correct patient, position, personnel, availability of appropriate equipment, and administration of perioperative antibiotics.  The thoracic and lumbar areas were widely prepped with chloraprep and draped into a sterile field. Fluoroscopy was used to plan a right paramedian incision at the T12-L2 levels, and an incision made with a 10 blade and carried down to the dorsolumbar fascia with the bovie and blunt dissection. Retractors were placed and a 14g [employer] tuohy needle placed into the epidural space at the T12-L1 interspace using biplanar fluoro and loss-of-resistance technique. The needle was aspirated without any return of fluid. A [employer] INFINION lead was introduced and under live AP fluoro advanced until the distal-most 4 contacts overlay the superior aspect T7 vertebral body shadow with the rest of the contacts distributed over the T7 and T8 vertebral bodies in a position just at anatomic midline. A second Infinion lead was placed just right of anatomic midline in the same levels using the same technique. The patient was awakened and the leads tested; impedances were good, and the patient reported good coverage with amplitudes in the 5-7 mA range. 0 silk sutures were placed in the  fascia adjacent to the needles. The needles and stylets were removed under fluoroscopy with no lead migration noted. Leads were then fixed to the fascia with clik-anchors fixation into position with the sutures; repeat images were obtained to verify that there had been no lead migration.  The incision was inspected and hemostasis obtained with the bipolar cautery.  Attention was then turned to creation of a subcutaneous pocket. At the left flank/buttock, a 3 cm incision was made with a 10 blade and using the bovie and blunt dissection a pocket of size appropriate to place a SCS generator. The pocket was trialed, and found to be of adequate size. The pocket was inspected for hemostasis, which was found to be excellent. Using reverse seldinger technique, the leads were tunneled to the pocket site, and the leads inserted into the SCS generator. Impedances were checked, and all found to be excellent. The leads were then all fixed into position with a self-torquing wrench. The wiring was all carefully coiled, placed behind the generator and placed in the pocket.  Both incisions were copiously irrigated with bacitracin-containing irrigation. The lumbar incision was closed in 2 deep layers of interrupted 2-0 vicryl and the skin closed with staples. The pocket incision was closed with a deeper layer of 2-0 vicryl interrupted sutures, and the skin closed with staples. Sterile dressings were applied. Needle, sponge, and instrument counts were correct x2 at the end of the case.  The patient was then carefully awakened from anesthesia, turned supine, an abdominal binder placed, and the patient taken to the recovery room where he underwent complex spinal cord stimulator programming.  COMPLICATIONS: NONE  CONDITION: Stable throughout the course of the procedure and immediately afterward  DISPOSITION: discharge to home, with antibiotics and pain  medicine. Discussed care with the patient and family member. Followup in  clinic will be scheduled in 10-14 days.

## 2016-04-12 NOTE — Transfer of Care (Signed)
Immediate Anesthesia Transfer of Care Note  Patient: Ralph [name]  Procedure(s) Performed: Procedure(s): LUMBAR SPINAL CORD STIMULATOR INSERTION (N/A)  Patient Location: PACU  Anesthesia Type:MAC  Level of Consciousness: awake, alert  and oriented  Airway & Oxygen Therapy: Patient Spontanous Breathing and Patient connected to nasal cannula oxygen  Post-op Assessment: Report given to RN and Post -op Vital signs reviewed and stable  Post vital signs: Reviewed and stable  Last Vitals:  Filed Vitals:   04/12/16 0555 04/12/16 0621  BP: 134/85   Pulse: 69   Temp:  36.9 C  Resp: 18     Last Pain: There were no vitals filed for this visit.    Patients Stated Pain Goal: 6 ([phone] [phone])  Complications: No apparent anesthesia complications

## 2016-04-12 NOTE — Anesthesia Preprocedure Evaluation (Addendum)
Anesthesia Evaluation  Patient identified by MRN, date of birth, ID band Patient awake    Reviewed: Allergy & Precautions, NPO status , Patient's Chart, lab work & pertinent test results  Airway Mallampati: III  TM Distance: >3 FB Neck ROM: Full   Comment: Large beard Dental no notable dental hx. (+) Caps   Pulmonary sleep apnea and Continuous Positive Airway Pressure Ventilation , former smoker,    Pulmonary exam normal breath sounds clear to auscultation       Cardiovascular negative cardio ROS Normal cardiovascular exam Rhythm:Regular Rate:Normal     Neuro/Psych Foot drop negative neurological ROS  negative psych ROS   GI/Hepatic Neg liver ROS, GERD  Medicated and Controlled,  Endo/Other  negative endocrine ROS  Renal/GU negative Renal ROS  negative genitourinary   Musculoskeletal negative musculoskeletal ROS (+)   Abdominal   Peds negative pediatric ROS (+)  Hematology negative hematology ROS (+)   Anesthesia Other Findings   Reproductive/Obstetrics negative OB ROS                           Anesthesia Physical Anesthesia Plan  ASA: II  Anesthesia Plan: MAC   Post-op Pain Management:    Induction:   Airway Management Planned: Simple Face Mask  Additional Equipment:   Intra-op Plan:   Post-operative Plan:   Informed Consent: I have reviewed the patients History and Physical, chart, labs and discussed the procedure including the risks, benefits and alternatives for the proposed anesthesia with the patient or authorized representative who has indicated his/her understanding and acceptance.   Dental advisory given  Plan Discussed with: CRNA  Anesthesia Plan Comments:         Anesthesia Quick Evaluation

## 2016-04-12 NOTE — Anesthesia Postprocedure Evaluation (Signed)
Anesthesia Post Note  Patient: Ralph [name]  Procedure(s) Performed: Procedure(s) (LRB): LUMBAR SPINAL CORD STIMULATOR INSERTION (N/A)  Patient location during evaluation: PACU Anesthesia Type: MAC Level of consciousness: awake and alert Pain management: pain level controlled Vital Signs Assessment: post-procedure vital signs reviewed and stable Respiratory status: spontaneous breathing, nonlabored ventilation, respiratory function stable and patient connected to nasal cannula oxygen Cardiovascular status: stable and blood pressure returned to baseline Anesthetic complications: no    Last Vitals:  Filed Vitals:   04/12/16 0930 04/12/16 0940  BP:    Pulse: 67 75  Temp:  36.4 C  Resp: 14 22    Last Pain: There were no vitals filed for this visit.               Montez Hageman

## 2016-04-15 ENCOUNTER — Encounter (HOSPITAL_COMMUNITY): Payer: Self-pay | Admitting: Anesthesiology

## 2016-04-19 ENCOUNTER — Ambulatory Visit: Attending: Physician Assistant | Admitting: Audiology

## 2016-04-19 DIAGNOSIS — H93293 Other abnormal auditory perceptions, bilateral: Secondary | ICD-10-CM | POA: Diagnosis present

## 2016-04-19 DIAGNOSIS — H903 Sensorineural hearing loss, bilateral: Secondary | ICD-10-CM | POA: Diagnosis present

## 2016-04-19 DIAGNOSIS — H9319 Tinnitus, unspecified ear: Secondary | ICD-10-CM | POA: Diagnosis present

## 2016-04-19 DIAGNOSIS — Z01118 Encounter for examination of ears and hearing with other abnormal findings: Secondary | ICD-10-CM | POA: Insufficient documentation

## 2016-04-19 DIAGNOSIS — R94128 Abnormal results of other function studies of ear and other special senses: Secondary | ICD-10-CM | POA: Diagnosis present

## 2016-04-19 DIAGNOSIS — H93213 Auditory recruitment, bilateral: Secondary | ICD-10-CM | POA: Insufficient documentation
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
## 2016-07-18 ENCOUNTER — Encounter: Payer: Self-pay | Admitting: Neurology

## 2016-07-18 ENCOUNTER — Ambulatory Visit (INDEPENDENT_AMBULATORY_CARE_PROVIDER_SITE_OTHER): Admitting: Neurology

## 2016-07-18 VITALS — BP 128/70 | HR 78 | Resp 16 | Ht 70.0 in | Wt 212.0 lb

## 2016-07-18 DIAGNOSIS — M21371 Foot drop, right foot: Secondary | ICD-10-CM

## 2016-07-18 DIAGNOSIS — J342 Deviated nasal septum: Secondary | ICD-10-CM | POA: Diagnosis not present

## 2016-07-18 DIAGNOSIS — G4733 Obstructive sleep apnea (adult) (pediatric): Secondary | ICD-10-CM

## 2016-07-18 DIAGNOSIS — Z9989 Dependence on other enabling machines and devices: Secondary | ICD-10-CM | POA: Diagnosis not present
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
## 2016-07-18 NOTE — Patient Instructions (Signed)

## 2016-08-12 ENCOUNTER — Encounter: Payer: Self-pay | Admitting: Adult Health

## 2017-01-11 ENCOUNTER — Other Ambulatory Visit: Payer: Self-pay | Admitting: Physician Assistant

## 2017-01-11 DIAGNOSIS — F329 Major depressive disorder, single episode, unspecified: Secondary | ICD-10-CM

## 2017-01-11 DIAGNOSIS — K219 Gastro-esophageal reflux disease without esophagitis: Secondary | ICD-10-CM

## 2017-01-11 DIAGNOSIS — E785 Hyperlipidemia, unspecified: Secondary | ICD-10-CM

## 2017-01-11 DIAGNOSIS — F32A Depression, unspecified: Secondary | ICD-10-CM

## 2017-07-16 ENCOUNTER — Other Ambulatory Visit: Payer: Self-pay | Admitting: Physician Assistant

## 2017-07-16 DIAGNOSIS — K219 Gastro-esophageal reflux disease without esophagitis: Secondary | ICD-10-CM

## 2017-07-16 DIAGNOSIS — E785 Hyperlipidemia, unspecified: Secondary | ICD-10-CM

## 2017-07-16 DIAGNOSIS — F32A Depression, unspecified: Secondary | ICD-10-CM

## 2017-07-16 DIAGNOSIS — F329 Major depressive disorder, single episode, unspecified: Secondary | ICD-10-CM

## 2017-07-21 ENCOUNTER — Encounter: Payer: Self-pay | Admitting: Neurology

## 2017-07-22 ENCOUNTER — Encounter: Payer: Self-pay | Admitting: Adult Health

## 2017-07-22 ENCOUNTER — Ambulatory Visit (INDEPENDENT_AMBULATORY_CARE_PROVIDER_SITE_OTHER): Admitting: Adult Health

## 2017-07-22 VITALS — BP 138/88 | HR 76 | Ht 70.0 in | Wt 215.0 lb

## 2017-07-22 DIAGNOSIS — G4733 Obstructive sleep apnea (adult) (pediatric): Secondary | ICD-10-CM | POA: Diagnosis not present

## 2017-07-22 DIAGNOSIS — Z9989 Dependence on other enabling machines and devices: Secondary | ICD-10-CM

## 2017-07-22 NOTE — Progress Notes (Signed)
PATIENT: Ralph [name] DOB: 04-12-1961  REASON FOR VISIT: follow up- osa on cpap HISTORY FROM: patient  HISTORY OF PRESENT ILLNESS: Today 07/22/17 [name] is a 56 year old male with a history of obstructive sleep apnea on CPAP. He returns today for a compliance download. His download indicates that he uses the machine nightly for a compliance 100%. He uses machine greater than 4 hours 25 out of 30 days for compliance of 83%. On average he uses his machine 6 hours and 5 minutes. His residual AHI is 3.6 on 7 cm of water with EPR 2. He states that some nights he was not able to use for greater than 4 hours because he felt that he could not breathe through his nostrils. Overall he states that the machine is working well for him. He does state that the nights he does take the mask off he does not sleep as well. He denies any new neurological symptoms. He returns today for an evaluation.  HISTORY Copied from Dr. Guadelupe Sabin notes 07/18/16: [name] is a 56 year old right-handed gentleman with an underlying medical history of reflux disease, restless leg syndrome, chronic constipation, lumbar spinal stenosis, followed by pain clinic, polycythemia, hyperlipidemia, depression, obesity, and right foot drop, who presents for follow-up consultation of his obstructive sleep apnea, on treatment with CPAP. The patient is unaccompanied today. I last saw him on 07/19/2015, at  which time he reported doing well. He had some issues with nasal congestion. I reviewed his CPAP supplies and we tried a different mask. He was fully compliant with CPAP and I suggested a one-year checkup.  07/18/2016: I reviewed his CPAP compliance data from 06/17/2016 through 07/16/2016 which is a total of 30 days, during which time he used his machine 27 days with percent used days greater than 4 hours at 83%, indicating very good compliance with an average usage of 5 hours and 41 minutes, residual AHI 2.3 per hour, leak on the high side with  the 95th percentile at 22.1 L/m on a pressure of 7 cm with EPR of 2.   07/18/2016: He reports doing well, compliant with CPAP. Had a spinal cord stimulator placed in June 2017, and doing much better, no pain meds, not going to pain clinic. tries to walk daily, mom lives up the road. Received supplies, uses nasal mask.   REVIEW OF SYSTEMS: Out of a complete 14 system review of symptoms, the patient complains only of the following symptoms, and all other reviewed systems are negative.  Ringing in ears, runny nose, frequent waking, bruise/bleed easily  ALLERGIES: Allergies  Allergen Reactions  . Antihistamine [Diphenhydramine Hcl] Other (See Comments)    nightmares  . Clindamycin/Lincomycin Other (See Comments)    Chest pains, heart burn  . Hydrocodone Rash  . Penicillins Rash    HOME MEDICATIONS: Outpatient Medications Prior to Visit  Medication Sig Dispense Refill  . aspirin 325 MG EC tablet Take 325 mg by mouth daily.    Marland Kitchen buPROPion (WELLBUTRIN XL) 300 MG 24 hr tablet TAKE 1 TABLET EVERY MORNING 30 tablet 0  . Cholecalciferol (VITAMIN D) 2000 units tablet Take 2,000 Units by mouth daily.    Marland Kitchen NIASPAN 500 MG CR tablet TAKE 1 TABLET AT BEDTIME 30 tablet 0  . omeprazole (PRILOSEC) 20 MG capsule TAKE 1 CAPSULE DAILY (NEED OFFICE VISIT) 30 capsule 0  . polyethylene glycol powder (GLYCOLAX/MIRALAX) powder Take 17 g by mouth daily. (Patient taking differently: Take 17 g by mouth daily as needed. )  1500 g 3   No facility-administered medications prior to visit.     PAST MEDICAL HISTORY: Past Medical History:  Diagnosis Date  . Arthritis   . Chronic back pain    displacement of lumbar intervertebral disc  . Constipation    takes Miralax daily as needed  . Depression    takes Wellbutrin daily  . Foot drop, right   . GERD (gastroesophageal reflux disease)    takes Omeprazole daily  . History of blood clots 1981   right hip  . History of colon polyps    benign  . Hyperlipidemia     takes Niacin daily  . Peripheral neuropathy ([place])   . Pneumonia    hx of-several times,late 80's  . Polycythemia    was seeing Dr[facility]Mohamed at [facility]  . Sleep apnea    uses CPAP    PAST SURGICAL HISTORY: Past Surgical History:  Procedure Laterality Date  . ACHILLES TENDON SURGERY Right 2008   lengthened  . COLONOSCOPY    . HIP SURGERY     right hip,  . HIP SURGERY     per patient [facility] - muscle removed for R hip  . SPINAL CORD STIMULATOR INSERTION N/A 04/12/2016   Procedure: LUMBAR SPINAL CORD STIMULATOR INSERTION;  Surgeon: Clydell Hakim, MD;  Location: [facility];  Service: Neurosurgery;  Laterality: N/A;  . VASECTOMY      FAMILY HISTORY: Family History  Problem Relation Age of Onset  . Hyperlipidemia Mother   . Cancer Father        colon  . Hypertension Father   . Other Neg Hx        hypogonadism    SOCIAL HISTORY: Social History   Social History  . Marital status: Married    Spouse name: [name]  . Number of children: 0  . Years of education: 14   Occupational History  . CAD Designer     [employer]   Social History Main Topics  . Smoking status: Former Smoker    Types: Cigarettes  . Smokeless tobacco: Never Used     Comment: quit smoking 5+ yrs ago  . Alcohol use No  . Drug use: No  . Sexual activity: Yes   Other Topics Concern  . Not on file   Social History Narrative   Patient consumes 4-cups of coffee and an quart of sweet tea.   Married   Education: College   Exercise:No      PHYSICAL EXAM  Vitals:   07/22/17 0727  BP: 138/88  Pulse: 76  Weight: 215 lb (97.5 kg)  Height: 5\' 10"  (1.778 m)   Body mass index is 30.85 kg/m.  Generalized: Well developed, in no acute distress   Neurological examination  Mentation: Alert oriented to time, place, history taking. Follows all commands speech and language fluent Cranial nerve II-XII: Pupils were equal round reactive to light. Extraocular movements were full, visual field were  full on confrontational test. Facial sensation and strength were normal. Uvula tongue midline. Head turning and shoulder shrug  were normal and symmetric. Motor: The motor testing reveals 5 over 5 strength of all 4 extremities. Good symmetric motor tone is noted throughout.  Sensory: Sensory testing is intact to soft touch on all 4 extremities. No evidence of extinction is noted.  Coordination: Cerebellar testing reveals good finger-nose-finger and heel-to-shin bilaterally.  Gait and station: Gait is normal.  Reflexes: Deep tendon reflexes are symmetric and normal bilaterally.   DIAGNOSTIC DATA (LABS,  IMAGING, TESTING) - I reviewed patient records, labs, notes, testing and imaging myself where available.  Lab Results  Component Value Date   WBC 9.5 04/04/2016   HGB 17.1 (H) 04/04/2016   HCT 49.5 04/04/2016   MCV 93.4 04/04/2016   PLT 153 04/04/2016      Component Value Date/Time   NA 138 04/04/2016 0929   NA 140 06/24/2012 0855   K 4.7 04/04/2016 0929   K 4.2 06/24/2012 0855   CL 103 04/04/2016 0929   CL 104 06/24/2012 0855   CO2 30 04/04/2016 0929   CO2 27 06/24/2012 0855   GLUCOSE 94 04/04/2016 0929   GLUCOSE 91 06/24/2012 0855   BUN 7 04/04/2016 0929   BUN 10.0 06/24/2012 0855   CREATININE 1.13 04/04/2016 0929   CREATININE 0.88 01/16/2016 1559   CREATININE 1.1 06/24/2012 0855   CALCIUM 9.4 04/04/2016 0929   CALCIUM 9.3 06/24/2012 0855   PROT 6.4 01/16/2016 1559   PROT 6.7 06/24/2012 0855   ALBUMIN 4.2 01/16/2016 1559   ALBUMIN 3.9 06/24/2012 0855   AST 13 01/16/2016 1559   AST 11 06/24/2012 0855   ALT 15 01/16/2016 1559   ALT 13 06/24/2012 0855   ALKPHOS 74 01/16/2016 1559   ALKPHOS 100 06/24/2012 0855   BILITOT 0.7 01/16/2016 1559   BILITOT 0.80 06/24/2012 0855   GFRNONAA >60 04/04/2016 0929   GFRNONAA 85 12/30/2013 0925   GFRAA >60 04/04/2016 0929   GFRAA >89 12/30/2013 0925   Lab Results  Component Value Date   CHOL 164 01/16/2016   HDL 40 01/16/2016    LDLCALC 107 01/16/2016   LDLDIRECT 120 (H) 01/27/2015   TRIG 87 01/16/2016   CHOLHDL 4.1 01/16/2016   Lab Results  Component Value Date   HGBA1C 5.2 12/30/2013   No results found for: [id number] Lab Results  Component Value Date   TSH 1.24 01/16/2016      ASSESSMENT AND PLAN 56 y.o. year old male  has a past medical history of Arthritis; Chronic back pain; Constipation; Depression; Foot drop, right; GERD (gastroesophageal reflux disease); History of blood clots (1981); History of colon polyps; Hyperlipidemia; Peripheral neuropathy ([place]); Pneumonia; Polycythemia; and Sleep apnea. here with :  1. OSA on CPAP  Patient's download shows good compliance and treatment of apnea. He is encouraged to continue using CPAP nightly. He is advised that he could try turning of his medication as that may help him breathe easier. He is advised that if his symptoms worsen or he develops new symptoms he should let us know. He will follow-up in one year with Dr. Rexene Alberts  I spent 15 minutes with the patient. 50% of this time was spent reviewing CPAP download    Ward Givens, MSN, NP-C 07/22/2017, 7:19 AM [facility] [address] [place], [place] [address] [phone]

## 2017-07-22 NOTE — Patient Instructions (Signed)
Your Plan:  Continue using CPAP nightly If your symptoms worsen or you develop new symptoms please let us know.   Thank you for coming to see us at [facility]. I hope we have been able to provide you high quality care today.  You may receive a patient satisfaction survey over the next few weeks. We would appreciate your feedback and comments so that we may continue to improve ourselves and the health of our patients.  

## 2017-07-22 NOTE — Progress Notes (Signed)
I agree with the assessment and plan as directed by NP .The patient is known to me .   Chanay Nugent, MD  

## 2017-08-11 ENCOUNTER — Telehealth: Payer: Self-pay | Admitting: Internal Medicine

## 2017-08-11 NOTE — Telephone Encounter (Signed)
Scheduled appt per 10/24 sch message - patient is aware of appt date and time.  

## 2017-08-20 ENCOUNTER — Other Ambulatory Visit: Payer: Self-pay | Admitting: Emergency Medicine

## 2017-08-20 DIAGNOSIS — D751 Secondary polycythemia: Secondary | ICD-10-CM

## 2017-08-21 ENCOUNTER — Other Ambulatory Visit (HOSPITAL_BASED_OUTPATIENT_CLINIC_OR_DEPARTMENT_OTHER)

## 2017-08-21 ENCOUNTER — Ambulatory Visit (HOSPITAL_BASED_OUTPATIENT_CLINIC_OR_DEPARTMENT_OTHER)

## 2017-08-21 ENCOUNTER — Ambulatory Visit (HOSPITAL_BASED_OUTPATIENT_CLINIC_OR_DEPARTMENT_OTHER): Admitting: Oncology

## 2017-08-21 ENCOUNTER — Encounter: Payer: Self-pay | Admitting: Oncology

## 2017-08-21 ENCOUNTER — Telehealth: Payer: Self-pay | Admitting: Oncology

## 2017-08-21 VITALS — BP 137/88 | HR 88 | Temp 98.3°F | Resp 18

## 2017-08-21 VITALS — BP 143/88 | HR 86 | Temp 97.8°F | Resp 17 | Ht 70.0 in | Wt 217.3 lb

## 2017-08-21 DIAGNOSIS — D751 Secondary polycythemia: Secondary | ICD-10-CM

## 2017-08-21 DIAGNOSIS — D45 Polycythemia vera: Secondary | ICD-10-CM | POA: Diagnosis not present

## 2017-08-21 LAB — CBC WITH DIFFERENTIAL/PLATELET
BASO%: 0.2 % (ref 0.0–2.0)
Basophils Absolute: 0 10*3/uL (ref 0.0–0.1)
EOS%: 2.8 % (ref 0.0–7.0)
Eosinophils Absolute: 0.2 10*3/uL (ref 0.0–0.5)
HCT: 48.2 % (ref 38.4–49.9)
HGB: 16.8 g/dL (ref 13.0–17.1)
LYMPH%: 34.6 % (ref 14.0–49.0)
MCH: 32.9 pg (ref 27.2–33.4)
MCHC: 34.9 g/dL (ref 32.0–36.0)
MCV: 94.5 fL (ref 79.3–98.0)
MONO#: 0.5 10*3/uL (ref 0.1–0.9)
MONO%: 5.6 % (ref 0.0–14.0)
NEUT#: 4.7 10*3/uL (ref 1.5–6.5)
NEUT%: 56.8 % (ref 39.0–75.0)
Platelets: 152 10*3/uL (ref 140–400)
RBC: 5.1 10*6/uL (ref 4.20–5.82)
RDW: 14.1 % (ref 11.0–14.6)
WBC: 8.2 10*3/uL (ref 4.0–10.3)
lymph#: 2.8 10*3/uL (ref 0.9–3.3)

## 2017-08-21 NOTE — Patient Instructions (Signed)

## 2017-08-21 NOTE — Telephone Encounter (Signed)
Scheduled patient according to 11/8 los

## 2017-08-21 NOTE — Progress Notes (Signed)
[facility] OFFICE PROGRESS NOTE  Hayden Rasmussen, MD [address]  [place] [address]  DIAGNOSIS: Reactive polycythemia.  PRIOR THERAPY: Phlebotomy on as needed basis.  CURRENT THERAPY: Phlebotomy on as needed basis.  INTERVAL HISTORY: Ralph [name] 56 y.o. male returns for a routine follow-up visit by himself.  The patient was last seen in this office in 2014.  At that time he was discharged back to his primary care provider with follow-up as needed.  Patient reports that he started experiencing some increased fatigue and sluggishness and was seen by his primary care provider with repeat labs.  He was referred back to [place] for reactive polycythemia.  The patient denies fevers and chills.  Denies chest pain, shortness breath, cough, hemoptysis.  Denies any significant weight loss or night sweats.  The patient had a repeat CBC performed earlier today and is here for evaluation discussion of his lab results.  MEDICAL HISTORY: Past Medical History:  Diagnosis Date  . Arthritis   . Chronic back pain    displacement of lumbar intervertebral disc  . Constipation    takes Miralax daily as needed  . Depression    takes Wellbutrin daily  . Foot drop, right   . GERD (gastroesophageal reflux disease)    takes Omeprazole daily  . History of blood clots 1981   right hip  . History of colon polyps    benign  . Hyperlipidemia    takes Niacin daily  . Peripheral neuropathy   . Pneumonia    hx of-several times,late 80's  . Polycythemia    was seeing Dr.Mohamed at [facility]  . Sleep apnea    uses CPAP    ALLERGIES:  is allergic to antihistamine [diphenhydramine hcl]; clindamycin/lincomycin; hydrocodone; and penicillins.  MEDICATIONS:  Current Outpatient Medications  Medication Sig Dispense Refill  . aspirin 325 MG EC tablet Take 325 mg by mouth daily.    Marland Kitchen buPROPion (WELLBUTRIN XL) 300 MG 24 hr tablet TAKE 1 TABLET EVERY MORNING 30 tablet 0  . Cholecalciferol  (VITAMIN D) 2000 units tablet Take 2,000 Units by mouth daily.    Marland Kitchen NIASPAN 500 MG CR tablet TAKE 1 TABLET AT BEDTIME 30 tablet 0  . omeprazole (PRILOSEC) 20 MG capsule TAKE 1 CAPSULE DAILY (NEED OFFICE VISIT) 30 capsule 0  . polyethylene glycol powder (GLYCOLAX/MIRALAX) powder Take 17 g by mouth daily. (Patient taking differently: Take 17 g by mouth daily as needed. ) 1500 g 3   No current facility-administered medications for this visit.     SURGICAL HISTORY:  Past Surgical History:  Procedure Laterality Date  . ACHILLES TENDON SURGERY Right 2008   lengthened  . COLONOSCOPY    . HIP SURGERY     right hip,  . HIP SURGERY     per patient [facility] - muscle removed for R hip  . VASECTOMY      REVIEW OF SYSTEMS:   Review of Systems  Constitutional: Negative for appetite change, chills, fever and unexpected weight change. Positive for fatigue. HENT:   Negative for mouth sores, nosebleeds, sore throat and trouble swallowing.   Eyes: Negative for eye problems and icterus.  Respiratory: Negative for cough, hemoptysis, shortness of breath and wheezing.   Cardiovascular: Negative for chest pain and leg swelling.  Gastrointestinal: Negative for abdominal pain, constipation, diarrhea, nausea and vomiting.  Genitourinary: Negative for bladder incontinence, difficulty urinating, dysuria, frequency and hematuria.   Musculoskeletal: Negative for back pain, gait  problem, neck pain and neck stiffness.  Skin: Negative for itching and rash.  Neurological: Negative for dizziness, extremity weakness, gait problem, headaches, light-headedness and seizures.  Hematological: Negative for adenopathy. Does not bruise/bleed easily.  Psychiatric/Behavioral: Negative for confusion, depression and sleep disturbance. The patient is not nervous/anxious.     PHYSICAL EXAMINATION:  Blood pressure (!) 143/88, pulse 86, temperature 97.8 F (36.6 C), temperature source Oral, resp. rate 17, height 5\' 10"   (1.778 m), weight 217 lb 4.8 oz (98.6 kg), SpO2 96 %.   Physical Exam  Constitutional: Oriented to person, place, and time and well-developed, well-nourished, and in no distress. No distress.  HENT:  Head: Normocephalic and atraumatic.  Mouth/Throat: Oropharynx is clear and moist. No oropharyngeal exudate.  Eyes: Conjunctivae are normal. Right eye exhibits no discharge. Left eye exhibits no discharge. No scleral icterus.  Neck: Normal range of motion. Neck supple.  Cardiovascular: Normal rate, regular rhythm, normal heart sounds and intact distal pulses.   Pulmonary/Chest: Effort normal and breath sounds normal. No respiratory distress. No wheezes. No rales.  Abdominal: Soft. Bowel sounds are normal. Exhibits no distension and no mass. There is no tenderness.  Musculoskeletal: Normal range of motion. Exhibits no edema.  Lymphadenopathy:    No cervical adenopathy.  Neurological: Alert and oriented to person, place, and time. Exhibits normal muscle tone. Gait normal. Coordination normal.  Skin: Skin is warm and dry. No rash noted. Not diaphoretic. No erythema. No pallor.  Psychiatric: Mood, memory and judgment normal.  Vitals reviewed.  LABORATORY DATA: Lab Results  Component Value Date   WBC 8.2 08/21/2017   HGB 16.8 08/21/2017   HCT 48.2 08/21/2017   MCV 94.5 08/21/2017   PLT 152 08/21/2017      Chemistry      Component Value Date/Time   NA 138 04/04/2016 0929   NA 140 06/24/2012 0855   K 4.7 04/04/2016 0929   K 4.2 06/24/2012 0855   CL 103 04/04/2016 0929   CL 104 06/24/2012 0855   CO2 30 04/04/2016 0929   CO2 27 06/24/2012 0855   BUN 7 04/04/2016 0929   BUN 10.0 06/24/2012 0855   CREATININE 1.13 04/04/2016 0929   CREATININE 0.88 01/16/2016 1559   CREATININE 1.1 06/24/2012 0855      Component Value Date/Time   CALCIUM 9.4 04/04/2016 0929   CALCIUM 9.3 06/24/2012 0855   ALKPHOS 74 01/16/2016 1559   ALKPHOS 100 06/24/2012 0855   AST 13 01/16/2016 1559   AST 11  06/24/2012 0855   ALT 15 01/16/2016 1559   ALT 13 06/24/2012 0855   BILITOT 0.7 01/16/2016 1559   BILITOT 0.80 06/24/2012 0855       RADIOGRAPHIC STUDIES:  No results found.   ASSESSMENT/PLAN:  Polycythemia This is a very pleasant 56 year old white male with history of reactive polycythemia has been observation and recently referred back to [place].  The patient quit smoking about 5-6 years ago.  He does have obstructive sleep apnea and uses CPAP consistently.  The patient was seen with Dr. Earlie Server.  Lab results were discussed with the patient.  His CBC is normal today.  His hematocrit is 48.2 and our goal hematocrit is about 45.  It is not clear if his fatigue is related to his polycythemia, but will go ahead and perform a therapeutic phlebotomy today.  Thereafter, he will continue on observation with his primary care physician. I will release him from the clinic at this point but will be happy to  see him in the future if needed.  He was advised to call if he has any significant abnormalities in his blood work. The patient agreed to the current plan.  All questions were answered. The patient knows to call the clinic with any problems, questions or concerns. We can certainly see the patient much sooner if necessary.   No orders of the defined types were placed in this encounter.   Mikey Bussing, DNP, AGPCNP-BC, AOCNP 08/21/17  ADDENDUM: Hematology/Oncology Attending: I had a face-to-face encounter with the patient.  They recommended his care plan.  This is a very pleasant 56 years old white male with history of polycythemia status post phlebotomy in the past and has been on observation for the last 4 years or so.  He presented recently to his primary care physician complaining of increasing fatigue and weakness.  Repeat CBC showed elevation of his hemoglobin and hematocrit.  The patient came today for evaluation and recommendation regarding his condition.  Repeat CBC today showed  hematocrit of 48.2.  I do not think this is the reason for his fatigue or weakness but I will arrange for the patient to have phlebotomy today to reduce his hematocrit to around 45%. I will discharge the patient back to his primary care physician but I will be happy to see him in the future if needed. He was advised to call if he has any other concerning issues. Disclaimer: This note was dictated with voice recognition software. Similar sounding words can inadvertently be transcribed and may be missed upon review. Eilleen Kempf, MD 08/22/17

## 2017-08-21 NOTE — Assessment & Plan Note (Signed)
This is a very pleasant 56 year old white male with history of reactive polycythemia has been observation and recently referred back to [place].  The patient quit smoking about 5-6 years ago.  He does have obstructive sleep apnea and uses CPAP consistently.  The patient was seen with Dr. Earlie Server.  Lab results were discussed with the patient.  His CBC is normal today.  His hematocrit is 48.2 and our goal hematocrit is about 45.  It is not clear if his fatigue is related to his polycythemia, but will go ahead and perform a therapeutic phlebotomy today.  Thereafter, he will continue on observation with his primary care physician. I will release him from the clinic at this point but will be happy to see him in the future if needed.  He was advised to call if he has any significant abnormalities in his blood work. The patient agreed to the current plan.  All questions were answered. The patient knows to call the clinic with any problems, questions or concerns. We can certainly see the patient much sooner if necessary.

## 2017-09-16 ENCOUNTER — Encounter: Payer: Self-pay | Admitting: Internal Medicine

## 2017-10-31 ENCOUNTER — Ambulatory Visit (AMBULATORY_SURGERY_CENTER): Payer: Self-pay | Admitting: *Deleted

## 2017-10-31 ENCOUNTER — Other Ambulatory Visit: Payer: Self-pay

## 2017-10-31 VITALS — Ht 70.0 in | Wt 217.0 lb

## 2017-10-31 DIAGNOSIS — Z800 Family history of malignant neoplasm of digestive organs: Secondary | ICD-10-CM

## 2017-10-31 NOTE — Progress Notes (Signed)
No egg or soy allergy known to patient  No issues with past sedation with any surgeries  or procedures, no intubation problems  No diet pills per patient No home 02 use per patient  No blood thinners per patient  Pt states  issues with constipation -- uses Miralax as needed- has BM's that varies once a day to once a week - will do a 2 day colon prep  No A fib or A flutter  EMMI video sent to pt's e mail - pt declined

## 2017-11-14 ENCOUNTER — Other Ambulatory Visit: Payer: Self-pay

## 2017-11-14 ENCOUNTER — Ambulatory Visit (AMBULATORY_SURGERY_CENTER): Admitting: Internal Medicine

## 2017-11-14 ENCOUNTER — Encounter: Payer: Self-pay | Admitting: Internal Medicine

## 2017-11-14 VITALS — BP 121/77 | HR 75 | Temp 98.4°F | Resp 11 | Ht 70.0 in | Wt 217.0 lb

## 2017-11-14 DIAGNOSIS — K621 Rectal polyp: Secondary | ICD-10-CM

## 2017-11-14 DIAGNOSIS — D124 Benign neoplasm of descending colon: Secondary | ICD-10-CM

## 2017-11-14 DIAGNOSIS — Z1211 Encounter for screening for malignant neoplasm of colon: Secondary | ICD-10-CM | POA: Diagnosis not present

## 2017-11-14 DIAGNOSIS — D129 Benign neoplasm of anus and anal canal: Secondary | ICD-10-CM

## 2017-11-14 DIAGNOSIS — Z1212 Encounter for screening for malignant neoplasm of rectum: Secondary | ICD-10-CM | POA: Diagnosis not present

## 2017-11-14 DIAGNOSIS — Z800 Family history of malignant neoplasm of digestive organs: Secondary | ICD-10-CM

## 2017-11-14 DIAGNOSIS — D128 Benign neoplasm of rectum: Secondary | ICD-10-CM

## 2017-11-14 MED ORDER — SODIUM CHLORIDE 0.9 % IV SOLN: 500.0000 mL | Freq: Once | INTRAVENOUS | Status: AC

## 2017-11-14 NOTE — Patient Instructions (Addendum)
   I found and removed 4 small polyps that all look benign.  I will let you know pathology results and when to have another routine colonoscopy by mail and/or My Chart.  I appreciate the opportunity to care for you. Gatha Mayer, MD, FACG  YOU HAD AN ENDOSCOPIC PROCEDURE TODAY AT [facility]:   Refer to the procedure report that was given to you for any specific questions about what was found during the examination.  If the procedure report does not answer your questions, please call your gastroenterologist to clarify.  If you requested that your care partner not be given the details of your procedure findings, then the procedure report has been included in a sealed envelope for you to review at your convenience later.  YOU SHOULD EXPECT: Some feelings of bloating in the abdomen. Passage of more gas than usual.  Walking can help get rid of the air that was put into your GI tract during the procedure and reduce the bloating. If you had a lower endoscopy (such as a colonoscopy or flexible sigmoidoscopy) you may notice spotting of blood in your stool or on the toilet paper. If you underwent a bowel prep for your procedure, you may not have a normal bowel movement for a few days.  Please Note:  You might notice some irritation and congestion in your nose or some drainage.  This is from the oxygen used during your procedure.  There is no need for concern and it should clear up in a day or so.  SYMPTOMS TO REPORT IMMEDIATELY:   Following lower endoscopy (colonoscopy or flexible sigmoidoscopy):  Excessive amounts of blood in the stool  Significant tenderness or worsening of abdominal pains  Swelling of the abdomen that is new, acute  Fever of 100F or higher  For urgent or emergent issues, a gastroenterologist can be reached at any hour by calling [phone].   DIET:  We do recommend a small meal at first, but then you may proceed to your regular diet.  Drink plenty of  fluids but you should avoid alcoholic beverages for 24 hours.  ACTIVITY:  You should plan to take it easy for the rest of today and you should NOT DRIVE or use heavy machinery until tomorrow (because of the sedation medicines used during the test).    FOLLOW UP: Our staff will call the number listed on your records the next business day following your procedure to check on you and address any questions or concerns that you may have regarding the information given to you following your procedure. If we do not reach you, we will leave a message.  However, if you are feeling well and you are not experiencing any problems, there is no need to return our call.  We will assume that you have returned to your regular daily activities without incident.  If any biopsies were taken you will be contacted by phone or by letter within the next 1-3 weeks.  Please call us at [phone] if you have not heard about the biopsies in 3 weeks.    SIGNATURES/CONFIDENTIALITY: You and/or your care partner have signed paperwork which will be entered into your electronic medical record.  These signatures attest to the fact that that the information above on your After Visit Summary has been reviewed and is understood.  Full responsibility of the confidentiality of this discharge information lies with you and/or your care-partner.

## 2017-11-14 NOTE — Op Note (Signed)
[facility] Patient Name: Ralph [name] Procedure Date: 11/14/2017 10:52 AM MRN: [id number] Endoscopist: Gatha Mayer , MD Age: 57 Referring MD:  Date of Birth: June 01, 1961 Gender: Male Account #: [id number] Procedure:                Colonoscopy Indications:              Screening in patient at increased risk: Colorectal                            cancer in father before age 32 Medicines:                Monitored Anesthesia Care Procedure:                Pre-Anesthesia Assessment:                           - Prior to the procedure, a History and Physical                            was performed, and patient medications and                            allergies were reviewed. The patient's tolerance of                            previous anesthesia was also reviewed. The risks                            and benefits of the procedure and the sedation                            options and risks were discussed with the patient.                            All questions were answered, and informed consent                            was obtained. Prior Anticoagulants: The patient has                            taken no previous anticoagulant or antiplatelet                            agents. ASA Grade Assessment: II - A patient with                            mild systemic disease. After reviewing the risks                            and benefits, the patient was deemed in                            satisfactory condition to undergo the procedure.  After obtaining informed consent, the colonoscope                            was passed under direct vision. Throughout the                            procedure, the patient's blood pressure, pulse, and                            oxygen saturations were monitored continuously. The                            Model CF-HQ190L [id number]) scope was introduced                            through the anus and advanced to  the the cecum,                            identified by appendiceal orifice and ileocecal                            valve. The colonoscopy was technically difficult                            and complex due to significant looping. Successful                            completion of the procedure was aided by using                            manual pressure. The patient tolerated the                            procedure well. The quality of the bowel                            preparation was good. The ileocecal valve,                            appendiceal orifice, and rectum were photographed.                            The bowel preparation used was Miralax. Scope In: 10:55:39 AM Scope Out: 11:15:21 AM Scope Withdrawal Time: 0 hours 13 minutes 26 seconds  Total Procedure Duration: 0 hours 19 minutes 42 seconds  Findings:                 A 7 mm polyp was found in the descending colon. The                            polyp was semi-pedunculated. The polyp was removed                            with a hot snare. Resection  and retrieval were                            complete. Verification of patient identification                            for the specimen was done. Estimated blood loss:                            none.                           Three sessile polyps were found in the rectum and                            descending colon. The polyps were diminutive in                            size. These polyps were removed with a cold snare.                            Resection and retrieval were complete. Verification                            of patient identification for the specimen was                            done. Estimated blood loss was minimal.                           The exam was otherwise without abnormality on                            direct and retroflexion views. Complications:            No immediate complications. Estimated Blood Loss:     Estimated blood  loss was minimal. Impression:               - One 7 mm polyp in the descending colon, removed                            with a hot snare. Resected and retrieved.                           - Three diminutive polyps in the rectum and in the                            descending colon, removed with a cold snare.                            Resected and retrieved.                           - The examination was otherwise normal on direct  and retroflexion views. Recommendation:           - Patient has a contact number available for                            emergencies. The signs and symptoms of potential                            delayed complications were discussed with the                            patient. Return to normal activities tomorrow.                            Written discharge instructions were provided to the                            patient.                           - Resume previous diet.                           - Continue present medications.                           - No aspirin, ibuprofen, naproxen, or other                            non-steroidal anti-inflammatory drugs for 2 weeks                            after polyp removal.                           - Repeat colonoscopy is recommended. The                            colonoscopy date will be determined after pathology                            results from today's exam become available for                            review. Gatha Mayer, MD 11/14/2017 11:20:41 AM This report has been signed electronically.

## 2017-11-14 NOTE — Progress Notes (Signed)
Pt's states no medical or surgical changes since previsit or office visit. 

## 2017-11-14 NOTE — Progress Notes (Signed)
Called to room to assist during endoscopic procedure.  Patient ID and intended procedure confirmed with present staff. Received instructions for my participation in the procedure from the performing physician.  

## 2017-11-14 NOTE — Progress Notes (Signed)
To recovery, report to RN, VSS. 

## 2017-11-20 ENCOUNTER — Encounter: Payer: Self-pay | Admitting: Internal Medicine

## 2017-11-20 DIAGNOSIS — Z860101 Personal history of adenomatous and serrated colon polyps: Secondary | ICD-10-CM

## 2017-11-20 DIAGNOSIS — Z86010 Personal history of colonic polyps: Secondary | ICD-10-CM

## 2017-11-20 HISTORY — DX: Personal history of adenomatous and serrated colon polyps: Z86.0101

## 2017-11-20 HISTORY — DX: Personal history of colonic polyps: Z86.010

## 2017-11-20 NOTE — Progress Notes (Signed)
2 adenomas 2 hyperplastic Recall 2024 My Chart letter

## 2018-05-25 ENCOUNTER — Emergency Department
Admission: EM | Admit: 2018-05-25 | Discharge: 2018-05-25 | Disposition: A | Discharge status: Home / Self Care | Attending: Emergency Medicine | Admitting: Emergency Medicine

## 2018-05-25 ENCOUNTER — Other Ambulatory Visit: Payer: Self-pay

## 2018-05-25 ENCOUNTER — Emergency Department

## 2018-05-25 DIAGNOSIS — K8020 Calculus of gallbladder without cholecystitis without obstruction: Secondary | ICD-10-CM | POA: Insufficient documentation

## 2018-05-25 DIAGNOSIS — Z7982 Long term (current) use of aspirin: Secondary | ICD-10-CM | POA: Insufficient documentation

## 2018-05-25 DIAGNOSIS — R112 Nausea with vomiting, unspecified: Secondary | ICD-10-CM | POA: Diagnosis present

## 2018-05-25 DIAGNOSIS — Z79899 Other long term (current) drug therapy: Secondary | ICD-10-CM | POA: Insufficient documentation

## 2018-05-25 DIAGNOSIS — Z87891 Personal history of nicotine dependence: Secondary | ICD-10-CM | POA: Diagnosis not present

## 2018-05-25 DIAGNOSIS — R197 Diarrhea, unspecified: Secondary | ICD-10-CM | POA: Insufficient documentation

## 2018-05-25 DIAGNOSIS — Z85828 Personal history of other malignant neoplasm of skin: Secondary | ICD-10-CM | POA: Diagnosis not present

## 2018-05-25 LAB — CBC
HCT: 50.5 % (ref 40.0–52.0)
Hemoglobin: 17.6 g/dL (ref 13.0–18.0)
MCH: 32.2 pg (ref 26.0–34.0)
MCHC: 35 g/dL (ref 32.0–36.0)
MCV: 92 fL (ref 80.0–100.0)
Platelets: 163 10*3/uL (ref 150–440)
RBC: 5.49 MIL/uL (ref 4.40–5.90)
RDW: 14.3 % (ref 11.5–14.5)
WBC: 13.6 10*3/uL — ABNORMAL HIGH (ref 3.8–10.6)

## 2018-05-25 LAB — COMPREHENSIVE METABOLIC PANEL
ALT: 15 U/L (ref 0–44)
AST: 18 U/L (ref 15–41)
Albumin: 4.4 g/dL (ref 3.5–5.0)
Alkaline Phosphatase: 80 U/L (ref 38–126)
Anion gap: 9 (ref 5–15)
BUN: 17 mg/dL (ref 6–20)
CO2: 25 mmol/L (ref 22–32)
Calcium: 8.8 mg/dL — ABNORMAL LOW (ref 8.9–10.3)
Chloride: 104 mmol/L (ref 98–111)
Creatinine, Ser: 1 mg/dL (ref 0.61–1.24)
GFR calc Af Amer: >60 mL/min (ref >60)
GFR calc non Af Amer: >60 mL/min (ref >60)
Glucose, Bld: 147 mg/dL — ABNORMAL HIGH (ref 70–99)
Potassium: 3.9 mmol/L (ref 3.5–5.1)
Sodium: 138 mmol/L (ref 135–145)
Total Bilirubin: 1.2 mg/dL (ref 0.3–1.2)
Total Protein: 7.5 g/dL (ref 6.5–8.1)

## 2018-05-25 MED ORDER — SODIUM CHLORIDE 0.9 % IV BOLUS
1000.0000 mL | Freq: Once | INTRAVENOUS | Status: AC
  Administered 2018-05-25: 1000 mL via INTRAVENOUS

## 2018-05-25 MED ORDER — ONDANSETRON 4 MG PO TBDP: 4.0000 mg | ORAL_TABLET | Freq: Three times a day (TID) | ORAL | 0 refills | Status: DC | PRN

## 2018-05-25 MED ORDER — ONDANSETRON HCL 4 MG/2ML IJ SOLN
4.0000 mg | Freq: Once | INTRAMUSCULAR | Status: AC
  Administered 2018-05-25: 4 mg via INTRAVENOUS
  Filled 2018-05-25: qty 2

## 2018-05-25 MED ORDER — IOHEXOL 300 MG/ML  SOLN
100.0000 mL | Freq: Once | INTRAMUSCULAR | Status: AC | PRN
  Administered 2018-05-25: 100 mL via INTRAVENOUS

## 2018-05-25 MED ORDER — TRAMADOL HCL 50 MG PO TABS: 50.0000 mg | ORAL_TABLET | Freq: Four times a day (QID) | ORAL | 0 refills | Status: DC | PRN

## 2018-05-25 NOTE — ED Triage Notes (Signed)
Patient to ED for vomiting that started at 8pm last night. Recently had the shingles shot and wonders if this might be related. Had headache on Friday all day and diarrhea as well.
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
## 2018-07-20 ENCOUNTER — Encounter: Payer: Self-pay | Admitting: Neurology

## 2018-07-22 ENCOUNTER — Ambulatory Visit (INDEPENDENT_AMBULATORY_CARE_PROVIDER_SITE_OTHER): Admitting: Neurology

## 2018-07-22 ENCOUNTER — Encounter: Payer: Self-pay | Admitting: Neurology

## 2018-07-22 VITALS — BP 146/88 | HR 66 | Ht 70.0 in | Wt 215.0 lb

## 2018-07-22 DIAGNOSIS — Z9989 Dependence on other enabling machines and devices: Secondary | ICD-10-CM | POA: Diagnosis not present

## 2018-07-22 DIAGNOSIS — G4733 Obstructive sleep apnea (adult) (pediatric): Secondary | ICD-10-CM | POA: Diagnosis not present

## 2018-07-22 NOTE — Progress Notes (Signed)
Order for supplies sent to [facility].

## 2018-07-22 NOTE — Patient Instructions (Signed)
Please continue using your CPAP regularly. While your insurance requires that you use CPAP at least 4 hours each night on 70% of the nights, I recommend, that you not skip any nights and use it throughout the night if you can. Getting used to CPAP and staying with the treatment long term does take time and patience and discipline. Untreated obstructive sleep apnea when it is moderate to severe can have an adverse impact on cardiovascular health and raise her risk for heart disease, arrhythmias, hypertension, congestive heart failure, stroke and diabetes. Untreated obstructive sleep apnea causes sleep disruption, nonrestorative sleep, and sleep deprivation. This can have an impact on your day to day functioning and cause daytime sleepiness and impairment of cognitive function, memory loss, mood disturbance, and problems focussing. Using CPAP regularly can improve these symptoms.  Keep up the good work! We can see you in 1 year, you can see one of our nurse practitioners as you are stable. 
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
## 2019-06-24 ENCOUNTER — Ambulatory Visit
Admission: RE | Admit: 2019-06-24 | Discharge: 2019-06-24 | Disposition: A | Discharge status: Home / Self Care | Source: Ambulatory Visit | Attending: Family Medicine | Admitting: Family Medicine

## 2019-06-24 ENCOUNTER — Other Ambulatory Visit: Payer: Self-pay | Admitting: Family Medicine

## 2019-06-24 DIAGNOSIS — R52 Pain, unspecified: Secondary | ICD-10-CM

## 2019-07-16 ENCOUNTER — Ambulatory Visit: Admitting: Podiatry

## 2019-07-26 ENCOUNTER — Ambulatory Visit (INDEPENDENT_AMBULATORY_CARE_PROVIDER_SITE_OTHER): Admitting: Adult Health

## 2019-07-26 ENCOUNTER — Encounter: Payer: Self-pay | Admitting: Adult Health

## 2019-07-26 ENCOUNTER — Other Ambulatory Visit: Payer: Self-pay

## 2019-07-26 VITALS — BP 143/91 | HR 73 | Temp 97.8°F | Ht 70.0 in | Wt 216.2 lb

## 2019-07-26 DIAGNOSIS — Z9989 Dependence on other enabling machines and devices: Secondary | ICD-10-CM

## 2019-07-26 DIAGNOSIS — G4733 Obstructive sleep apnea (adult) (pediatric): Secondary | ICD-10-CM | POA: Diagnosis not present
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
## 2019-07-26 NOTE — Patient Instructions (Signed)

## 2019-07-30 ENCOUNTER — Other Ambulatory Visit: Payer: Self-pay

## 2019-07-30 ENCOUNTER — Encounter: Payer: Self-pay | Admitting: Podiatry

## 2019-07-30 ENCOUNTER — Ambulatory Visit (INDEPENDENT_AMBULATORY_CARE_PROVIDER_SITE_OTHER): Admitting: Podiatry

## 2019-07-30 VITALS — BP 146/88 | HR 79 | Resp 16

## 2019-07-30 DIAGNOSIS — B070 Plantar wart: Secondary | ICD-10-CM

## 2019-07-30 DIAGNOSIS — L989 Disorder of the skin and subcutaneous tissue, unspecified: Secondary | ICD-10-CM
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
## 2019-08-13 ENCOUNTER — Other Ambulatory Visit: Payer: Self-pay

## 2019-08-13 ENCOUNTER — Encounter: Payer: Self-pay | Admitting: Podiatry

## 2019-08-13 ENCOUNTER — Ambulatory Visit (INDEPENDENT_AMBULATORY_CARE_PROVIDER_SITE_OTHER): Admitting: Podiatry

## 2019-08-13 DIAGNOSIS — L989 Disorder of the skin and subcutaneous tissue, unspecified: Secondary | ICD-10-CM | POA: Diagnosis not present
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
## 2019-09-21 ENCOUNTER — Other Ambulatory Visit: Payer: Self-pay

## 2019-09-21 ENCOUNTER — Ambulatory Visit (INDEPENDENT_AMBULATORY_CARE_PROVIDER_SITE_OTHER): Admitting: Podiatry

## 2019-09-21 ENCOUNTER — Ambulatory Visit (INDEPENDENT_AMBULATORY_CARE_PROVIDER_SITE_OTHER)

## 2019-09-21 ENCOUNTER — Encounter: Payer: Self-pay | Admitting: Podiatry

## 2019-09-21 DIAGNOSIS — M7751 Other enthesopathy of right foot: Secondary | ICD-10-CM

## 2019-09-21 DIAGNOSIS — L989 Disorder of the skin and subcutaneous tissue, unspecified: Secondary | ICD-10-CM
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
## 2019-10-19 ENCOUNTER — Encounter: Payer: Self-pay | Admitting: Podiatry

## 2019-10-19 ENCOUNTER — Other Ambulatory Visit: Payer: Self-pay

## 2019-10-19 ENCOUNTER — Ambulatory Visit (INDEPENDENT_AMBULATORY_CARE_PROVIDER_SITE_OTHER): Admitting: Podiatry

## 2019-10-19 DIAGNOSIS — M7751 Other enthesopathy of right foot: Secondary | ICD-10-CM

## 2019-10-19 DIAGNOSIS — L989 Disorder of the skin and subcutaneous tissue, unspecified: Secondary | ICD-10-CM

## 2019-10-19 NOTE — Patient Instructions (Signed)
Pre-Operative Instructions  Congratulations, you have decided to take an important step towards improving your quality of life.  You can be assured that the doctors and staff at [facility] will be with you every step of the way.  Here are some important things you should know:  1. Plan to be at the surgery center/hospital at least 1 (one) hour prior to your scheduled time, unless otherwise directed by the surgical center/hospital staff.  You must have a responsible adult accompany you, remain during the surgery and drive you home.  Make sure you have directions to the surgical center/hospital to ensure you arrive on time. 2. If you are having surgery at [facility] or [facility] hospitals, you will need a copy of your medical history and physical form from your family physician within one month prior to the date of surgery. We will give you a form for your primary physician to complete.  3. We make every effort to accommodate the date you request for surgery.  However, there are times where surgery dates or times have to be moved.  We will contact you as soon as possible if a change in schedule is required.   4. No aspirin/ibuprofen for one week before surgery.  If you are on aspirin, any non-steroidal anti-inflammatory medications (Mobic, Aleve, Ibuprofen) should not be taken seven (7) days prior to your surgery.  You make take Tylenol for pain prior to surgery.  5. Medications - If you are taking daily heart and blood pressure medications, seizure, reflux, allergy, asthma, anxiety, pain or diabetes medications, make sure you notify the surgery center/hospital before the day of surgery so they can tell you which medications you should take or avoid the day of surgery. 6. No food or drink after midnight the night before surgery unless directed otherwise by surgical center/hospital staff. 7. No alcoholic beverages 24-hours prior to surgery.  No smoking 24-hours prior or 24-hours after  surgery. 8. Wear loose pants or shorts. They should be loose enough to fit over bandages, boots, and casts. 9. Don't wear slip-on shoes. Sneakers are preferred. 10. Bring your boot with you to the surgery center/hospital.  Also bring crutches or a walker if your physician has prescribed it for you.  If you do not have this equipment, it will be provided for you after surgery. 11. If you have not been contacted by the surgery center/hospital by the day before your surgery, call to confirm the date and time of your surgery. 12. Leave-time from work may vary depending on the type of surgery you have.  Appropriate arrangements should be made prior to surgery with your employer. 13. Prescriptions will be provided immediately following surgery by your doctor.  Fill these as soon as possible after surgery and take the medication as directed. Pain medications will not be refilled on weekends and must be approved by the doctor. 14. Remove nail polish on the operative foot and avoid getting pedicures prior to surgery. 15. Wash the night before surgery.  The night before surgery wash the foot and leg well with water and the antibacterial soap provided. Be sure to pay special attention to beneath the toenails and in between the toes.  Wash for at least three (3) minutes. Rinse thoroughly with water and dry well with a towel.  Perform this wash unless told not to do so by your physician.  Enclosed: 1 Ice pack (please put in freezer the night before surgery)   1 Hibiclens skin cleaner     Pre-op instructions  If you have any questions regarding the instructions, please do not hesitate to call our office.  [place]: [address], , [place] [address] -- [phone]  [place]: [address] [place], [place] [address] -- [phone]  [place]: [address], [place], [place] [address] -- [phone]   Website: [email or web address] 
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
## 2020-06-26 ENCOUNTER — Ambulatory Visit: Attending: Internal Medicine

## 2020-06-26 DIAGNOSIS — Z23 Encounter for immunization: Secondary | ICD-10-CM

## 2020-06-26 NOTE — Progress Notes (Signed)
° °  Covid-19 Vaccination Clinic  Name:  MASUD HOLUB    MRN: [id number] DOB: 10-Sep-1961  06/26/2020  Mr. Gehl was observed post Covid-19 immunization for 15 minutes without incident. He was provided with Vaccine Information Sheet and instruction to access the V-Safe system.   Mr. Voong was instructed to call 911 with any severe reactions post vaccine:  Difficulty breathing   Swelling of face and throat   A fast heartbeat   A bad rash all over body   Dizziness and weakness   Immunizations Administered    Name Date Dose VIS Date Route   Pfizer COVID-19 Vaccine 06/26/2020 10:36 AM 0.3 mL 12/08/2018 Intramuscular   Manufacturer: [place]   Lot: [id number]   [place]: [address]-9

## 2020-07-17 ENCOUNTER — Ambulatory Visit: Attending: Internal Medicine

## 2020-07-17 DIAGNOSIS — Z23 Encounter for immunization: Secondary | ICD-10-CM

## 2020-07-17 NOTE — Progress Notes (Signed)
   Covid-19 Vaccination Clinic  Name:  Ralph [name]    MRN: [id number] DOB: 01-21-1961  07/17/2020  [name] was observed post Covid-19 immunization for 15 minutes without incident. He was provided with Vaccine Information Sheet and instruction to access the V-Safe system.   [name] was instructed to call 911 with any severe reactions post vaccine: Marland Kitchen Difficulty breathing  . Swelling of face and throat  . A fast heartbeat  . A bad rash all over body  . Dizziness and weakness   Immunizations Administered    Name Date Dose VIS Date Route   Pfizer COVID-19 Vaccine 07/17/2020 10:46 AM 0.3 mL 12/08/2018 Intramuscular   Manufacturer: [place]   Lot: [id number]   [place]: [id number]

## 2020-07-25 ENCOUNTER — Other Ambulatory Visit: Payer: Self-pay

## 2020-07-25 ENCOUNTER — Encounter: Payer: Self-pay | Admitting: Adult Health

## 2020-07-25 ENCOUNTER — Ambulatory Visit (INDEPENDENT_AMBULATORY_CARE_PROVIDER_SITE_OTHER): Admitting: Adult Health

## 2020-07-25 VITALS — BP 128/82 | HR 69 | Ht 70.0 in | Wt 212.4 lb

## 2020-07-25 DIAGNOSIS — G4733 Obstructive sleep apnea (adult) (pediatric): Secondary | ICD-10-CM

## 2020-07-25 DIAGNOSIS — Z9989 Dependence on other enabling machines and devices: Secondary | ICD-10-CM

## 2020-07-25 NOTE — Patient Instructions (Signed)
Your Plan:  Continue using CPAP nightly If your symptoms worsen or you develop new symptoms please let us know.   Thank you for coming to see us at [facility]. I hope we have been able to provide you high quality care today.  You may receive a patient satisfaction survey over the next few weeks. We would appreciate your feedback and comments so that we may continue to improve ourselves and the health of our patients.  
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
## 2020-08-17 ENCOUNTER — Telehealth: Payer: Self-pay | Admitting: Internal Medicine

## 2020-08-17 ENCOUNTER — Telehealth: Payer: Self-pay

## 2020-08-17 NOTE — Telephone Encounter (Signed)
Pt LM stating he had a recent physical with Dr. Winona Legato and was advised his hgb = 18. Pt states he was advised to contact Dr. Julien Nordmann for follow-up.  Schedule message sent for pt to be seen. Per Dr. Julien Nordmann, within 2 weeks.

## 2020-08-17 NOTE — Telephone Encounter (Signed)
Scheduled appt per 11/4 sch msg - pt is aware of appt date and time

## 2020-08-31 ENCOUNTER — Inpatient Hospital Stay

## 2020-08-31 ENCOUNTER — Encounter: Payer: Self-pay | Admitting: Internal Medicine

## 2020-08-31 ENCOUNTER — Inpatient Hospital Stay: Attending: Internal Medicine | Admitting: Internal Medicine

## 2020-08-31 ENCOUNTER — Other Ambulatory Visit: Payer: Self-pay

## 2020-08-31 VITALS — BP 127/81 | HR 85 | Temp 98.5°F | Resp 18 | Ht 70.0 in | Wt 211.0 lb

## 2020-08-31 DIAGNOSIS — F32A Depression, unspecified: Secondary | ICD-10-CM | POA: Insufficient documentation

## 2020-08-31 DIAGNOSIS — R5383 Other fatigue: Secondary | ICD-10-CM | POA: Diagnosis not present

## 2020-08-31 DIAGNOSIS — R0602 Shortness of breath: Secondary | ICD-10-CM | POA: Insufficient documentation

## 2020-08-31 DIAGNOSIS — D751 Secondary polycythemia: Secondary | ICD-10-CM

## 2020-08-31 DIAGNOSIS — Z8719 Personal history of other diseases of the digestive system: Secondary | ICD-10-CM | POA: Insufficient documentation

## 2020-08-31 DIAGNOSIS — Z86010 Personal history of colonic polyps: Secondary | ICD-10-CM | POA: Diagnosis not present

## 2020-08-31 LAB — CBC WITH DIFFERENTIAL (CANCER CENTER ONLY)
Abs Immature Granulocytes: 0.04 10*3/uL (ref 0.00–0.07)
Basophils Absolute: 0 10*3/uL (ref 0.0–0.1)
Basophils Relative: 0 %
Eosinophils Absolute: 0.2 10*3/uL (ref 0.0–0.5)
Eosinophils Relative: 1 %
HCT: 50.9 % (ref 39.0–52.0)
Hemoglobin: 17.5 g/dL — ABNORMAL HIGH (ref 13.0–17.0)
Immature Granulocytes: 0 %
Lymphocytes Relative: 32 %
Lymphs Abs: 3.6 10*3/uL (ref 0.7–4.0)
MCH: 30.8 pg (ref 26.0–34.0)
MCHC: 34.4 g/dL (ref 30.0–36.0)
MCV: 89.5 fL (ref 80.0–100.0)
Monocytes Absolute: 0.6 10*3/uL (ref 0.1–1.0)
Monocytes Relative: 5 %
Neutro Abs: 6.7 10*3/uL (ref 1.7–7.7)
Neutrophils Relative %: 62 %
Platelet Count: 180 10*3/uL (ref 150–400)
RBC: 5.69 MIL/uL (ref 4.22–5.81)
RDW: 13.8 % (ref 11.5–15.5)
WBC Count: 11.1 10*3/uL — ABNORMAL HIGH (ref 4.0–10.5)
nRBC: 0 % (ref 0.0–0.2)
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
## 2021-02-06 ENCOUNTER — Ambulatory Visit: Attending: Internal Medicine

## 2021-02-06 DIAGNOSIS — Z23 Encounter for immunization: Secondary | ICD-10-CM

## 2021-02-06 NOTE — Progress Notes (Signed)
   Covid-19 Vaccination Clinic  Name:  Ralph [name]    MRN: [id number] DOB: 1961/07/20  02/06/2021  [name] was observed post Covid-19 immunization for 15 minutes without incident. He was provided with Vaccine Information Sheet and instruction to access the V-Safe system.   [name] was instructed to call 911 with any severe reactions post vaccine: Marland Kitchen Difficulty breathing  . Swelling of face and throat  . A fast heartbeat  . A bad rash all over body  . Dizziness and weakness   Immunizations Administered    Name Date Dose VIS Date Route   PFIZER Comrnaty([place]) Covid-19 Vaccine 02/06/2021 10:16 AM 0.3 mL 09/21/2020 Intramuscular   Manufacturer: [employer]   Lot: [id number]   NDC: [phone]

## 2021-02-07 ENCOUNTER — Other Ambulatory Visit: Payer: Self-pay

## 2021-02-07 MED ORDER — PFIZER-BIONT COVID-19 VAC-TRIS 30 MCG/0.3ML IM SUSP
INTRAMUSCULAR | 0 refills | Status: AC
  Filled 2021-02-07: qty 0.3, 1d supply, fill #0

## 2021-07-31 ENCOUNTER — Telehealth: Admitting: Adult Health
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
## 2021-09-12 ENCOUNTER — Emergency Department
Admission: EM | Admit: 2021-09-12 | Discharge: 2021-09-12 | Disposition: A | Discharge status: Home / Self Care | Attending: Emergency Medicine | Admitting: Emergency Medicine

## 2021-09-12 ENCOUNTER — Other Ambulatory Visit: Payer: Self-pay

## 2021-09-12 ENCOUNTER — Emergency Department

## 2021-09-12 DIAGNOSIS — M79602 Pain in left arm: Secondary | ICD-10-CM | POA: Insufficient documentation

## 2021-09-12 DIAGNOSIS — Z7982 Long term (current) use of aspirin: Secondary | ICD-10-CM | POA: Insufficient documentation

## 2021-09-12 DIAGNOSIS — R202 Paresthesia of skin: Secondary | ICD-10-CM | POA: Diagnosis present

## 2021-09-12 DIAGNOSIS — Z87891 Personal history of nicotine dependence: Secondary | ICD-10-CM | POA: Diagnosis not present

## 2021-09-12 DIAGNOSIS — R079 Chest pain, unspecified: Secondary | ICD-10-CM | POA: Diagnosis not present

## 2021-09-12 DIAGNOSIS — R200 Anesthesia of skin: Secondary | ICD-10-CM

## 2021-09-12 DIAGNOSIS — Z85828 Personal history of other malignant neoplasm of skin: Secondary | ICD-10-CM | POA: Insufficient documentation

## 2021-09-12 LAB — BASIC METABOLIC PANEL
Anion gap: 6 (ref 5–15)
BUN: 8 mg/dL (ref 6–20)
CO2: 29 mmol/L (ref 22–32)
Calcium: 9 mg/dL (ref 8.9–10.3)
Chloride: 101 mmol/L (ref 98–111)
Creatinine, Ser: 1.01 mg/dL (ref 0.61–1.24)
GFR, Estimated: >60 mL/min (ref >60)
Glucose, Bld: 113 mg/dL — ABNORMAL HIGH (ref 70–99)
Potassium: 3.4 mmol/L — ABNORMAL LOW (ref 3.5–5.1)
Sodium: 136 mmol/L (ref 135–145)

## 2021-09-12 LAB — CBC WITH DIFFERENTIAL/PLATELET
Abs Immature Granulocytes: 0.04 10*3/uL (ref 0.00–0.07)
Basophils Absolute: 0.1 10*3/uL (ref 0.0–0.1)
Basophils Relative: 1 %
Eosinophils Absolute: 0.1 10*3/uL (ref 0.0–0.5)
Eosinophils Relative: 1 %
HCT: 51.9 % (ref 39.0–52.0)
Hemoglobin: 18 g/dL — ABNORMAL HIGH (ref 13.0–17.0)
Immature Granulocytes: 0 %
Lymphocytes Relative: 30 %
Lymphs Abs: 2.9 10*3/uL (ref 0.7–4.0)
MCH: 31.3 pg (ref 26.0–34.0)
MCHC: 34.7 g/dL (ref 30.0–36.0)
MCV: 90.1 fL (ref 80.0–100.0)
Monocytes Absolute: 0.6 10*3/uL (ref 0.1–1.0)
Monocytes Relative: 6 %
Neutro Abs: 6.2 10*3/uL (ref 1.7–7.7)
Neutrophils Relative %: 62 %
Platelets: 155 10*3/uL (ref 150–400)
RBC: 5.76 MIL/uL (ref 4.22–5.81)
RDW: 13.8 % (ref 11.5–15.5)
WBC: 9.9 10*3/uL (ref 4.0–10.5)
nRBC: 0 % (ref 0.0–0.2)

## 2021-09-12 LAB — CBG MONITORING, ED: Glucose-Capillary: 99 mg/dL (ref 70–99)

## 2021-09-12 LAB — TROPONIN I (HIGH SENSITIVITY)
Troponin I (High Sensitivity): 5 ng/L (ref <18)
Troponin I (High Sensitivity): 3 ng/L (ref <18)

## 2021-09-12 NOTE — ED Provider Notes (Signed)
  Emergency Medicine Provider Triage Evaluation Note  Ralph [name] , a 60 y.o.male,  was evaluated in triage.  Pt complains of intermittent chest pain starting 4 days prior.  With associated left arm numbness.  Patient is not actively experiencing any chest pain at this time but continues to have left arm numbness and headache.  Denies slurred speech, difficulty ambulating, or LOC.  Review of Systems  Positive: Chest pain, left arm numbness, headaches Negative: Denies fever, vomiting, abdominal pain  Physical Exam  There were no vitals filed for this visit. Gen:   Awake, no distress   Resp:  Normal effort  MSK:   Moves extremities without difficulty  Other:  Cranial nerves II through XII intact.  No slurred speech or facial droop.  Normal strength and sensation in upper and lower extremities at this time.  Medical Decision Making  Given the patient's initial medical screening exam, the following diagnostic evaluation has been ordered. The patient will be placed in the appropriate treatment space, once one is available, to complete the evaluation and treatment. I have discussed the plan of care with the patient and I have advised the patient that an ED physician or mid-level practitioner will reevaluate their condition after the test results have been received, as the results may give them additional insight into the type of treatment they may need.    Diagnostics: Labs, EKG, CXR, head CT  Treatments: none immediately   Teodoro Spray, PA 09/12/21 1447    Duffy Bruce, MD 09/12/21 2200

## 2021-09-12 NOTE — Discharge Instructions (Signed)
Your EKG and blood work were reassuring that you are not having a heart attack.  The CAT scan of your head was normal.  I think it is low likelihood that this is related to a brain process and is more likely related to a peripheral nerve problem.  Please follow-up with your primary care provider for further evaluation.  If you develop any new neurologic symptoms including weakness in the arm or numbness in any part of their part of your body, please return to the emergency department.
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
## 2021-09-12 NOTE — ED Triage Notes (Signed)
Pt reports intermittent chest pain that started over the weekend. Over the last 2 hours started having numbness to left arm. Ambulatory. Equil grip and strength in extremities.  Face symmetrical. Clear speech. Alert and oriented.  Cbg 99 Orders [place]

## 2021-09-17 ENCOUNTER — Encounter: Payer: Self-pay | Admitting: *Deleted

## 2021-12-13 ENCOUNTER — Ambulatory Visit (INDEPENDENT_AMBULATORY_CARE_PROVIDER_SITE_OTHER): Admitting: Neurology

## 2021-12-13 ENCOUNTER — Encounter: Payer: Self-pay | Admitting: Neurology

## 2021-12-13 VITALS — BP 126/75 | HR 79 | Ht 69.0 in | Wt 210.2 lb

## 2021-12-13 DIAGNOSIS — Z9989 Dependence on other enabling machines and devices: Secondary | ICD-10-CM

## 2021-12-13 DIAGNOSIS — G4733 Obstructive sleep apnea (adult) (pediatric): Secondary | ICD-10-CM | POA: Diagnosis not present

## 2021-12-13 DIAGNOSIS — E669 Obesity, unspecified: Secondary | ICD-10-CM
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
## 2021-12-13 NOTE — Patient Instructions (Signed)
Thank you for choosing [facility] for your sleep related care! ?It was nice to meet you today! I appreciate that you entrust me with your sleep related healthcare concerns. I hope, I was able to address at least some of your concerns today, and that I can help you feel reassured and also get better.   ? ?Here is what we discussed today and what we came up with as our plan for you:  ? ?Since you would like to switch DME companies, you may want to call these 2 companies:  ?[facility]: [phone] ?[facility]: [phone] ?  ?Based on your symptoms and your exam I believe you are still at risk for obstructive sleep apnea and would benefit from re-evaluation as it has been many years and you need new supplies and an updated machine. Therefore, I think we should proceed with a sleep study to determine how severe your sleep apnea is. If you have more than mild OSA, I want you to consider ongoing treatment with CPAP. Please remember, the risks and ramifications of moderate to severe obstructive sleep apnea or OSA are: Cardiovascular disease, including congestive heart failure, stroke, difficult to control hypertension, arrhythmias, and even type 2 diabetes has been linked to untreated OSA. Sleep apnea causes disruption of sleep and sleep deprivation in most cases, which, in turn, can cause recurrent headaches, problems with memory, mood, concentration, focus, and vigilance. Most people with untreated sleep apnea report excessive daytime sleepiness, which can affect their ability to drive. Please do not drive if you feel sleepy.  ? ?I will likely see you back after your sleep study to go over the test results and where to go from there. We will call you after your sleep study to advise about the results (most likely, you will hear from one of my nurses) and to set up an appointment at the time, as necessary.   ? ?Our sleep lab administrative assistant will call you to schedule your sleep study. If you  don't hear back from her by about 2 weeks from now, please feel free to call her at [phone]. You can leave a message with your phone number and concerns, if you get the voicemail box. She will call back as soon as possible.  ? ? ?

## 2021-12-31 ENCOUNTER — Telehealth: Payer: Self-pay | Admitting: Neurology

## 2021-12-31 NOTE — Telephone Encounter (Signed)
I am sorry, not sure why I did not order the home sleep test at the time.  Meagan, can you expedite authorization? ?Home sleep test ordered as addendum to his recent office visit. ?

## 2021-12-31 NOTE — Addendum Note (Signed)
Addended by: Star Age on: 12/31/2021 11:18 AM ? ? Modules accepted: Orders ? ?

## 2021-12-31 NOTE — Telephone Encounter (Signed)
Patient called because he has not heard from our sleep lab yet. States that Dr. Rexene Alberts told him to reach out if he didn't hear from [place] in two weeks. ?

## 2022-01-09 ENCOUNTER — Ambulatory Visit (INDEPENDENT_AMBULATORY_CARE_PROVIDER_SITE_OTHER): Admitting: Neurology

## 2022-01-09 DIAGNOSIS — G4733 Obstructive sleep apnea (adult) (pediatric): Secondary | ICD-10-CM

## 2022-01-09 DIAGNOSIS — E669 Obesity, unspecified: Secondary | ICD-10-CM

## 2022-01-14 NOTE — Progress Notes (Signed)
See procedure note.

## 2022-01-15 NOTE — Procedures (Signed)
?  ? ?  [facility] ? ?HOME SLEEP TEST (Watch PAT) REPORT ? ?STUDY DATE: 01/09/22 ? ?DOB: 12/02/1960 ? ?MRN: [id number] ? ?ORDERING CLINICIAN: Star Age, MD, PhD ?  ?CLINICAL INFORMATION/HISTORY: 61 year old right-handed gentleman with an underlying medical history of reflux disease, restless leg syndrome, chronic constipation, lumbar spinal stenosis, followed by pain clinic, polycythemia, hyperlipidemia, depression, obesity, and right foot drop, who presents for reevaluation of his obstructive sleep apnea.  He has been compliant with his CPAP, he should be eligible for a new machine.  He was diagnosed with severe obstructive sleep apnea in 2016.  He has benefited from CPAP therapy. ? ?BMI: 31 kg/m? ? ?FINDINGS:  ? ?Sleep Summary:  ? ?Total Recording Time (hours, min): 8 hours, 3 minutes ? ?Total Sleep Time (hours, min):  7 hours, 23 minutes  ? ?Percent REM (%):    21%  ? ?Respiratory Indices:  ? ?Calculated pAHI (per hour):  6.6/hour        ? ?REM pAHI:    5.8/hour      ? ?NREM pAHI: 6.8/hour ? ?Oxygen Saturation Statistics:  ?  ?Oxygen Saturation (%) Mean: 93%  ? ?Minimum oxygen saturation (%):                 83%  ? ?O2 Saturation Range (%): 83-98%   ? ?O2 Saturation (minutes) <=88%: 4.2 min ? ?Pulse Rate Statistics:  ? ?Pulse Mean (bpm):    59/min   ? ?Pulse Range (49-87/min)  ? ?IMPRESSION: OSA (obstructive sleep apnea)  ? ?RECOMMENDATION:  ?This home sleep test demonstrates overall mild obstructive sleep apnea with a total AHI of 6.6/hour and O2 nadir of 83%.  Intermittent mild to moderate snoring was detected.  Given the patient's medical history and good results with CPAP therapy, ongoing treatment with positive airway pressure is recommended. This can be achieved in the form of autoPAP trial/titration at home.  I will write for a new machine.  The patient should be eligible for new equipment.   ?Alternative treatments include weight loss along with avoidance of the supine sleep position, or  an oral appliance in appropriate candidates.   ?Please note that untreated obstructive sleep apnea may carry additional perioperative morbidity. Patients with significant obstructive sleep apnea should receive perioperative PAP therapy and the surgeons and particularly the anesthesiologist should be informed of the diagnosis and the severity of the sleep disordered breathing.  ?The patient should be cautioned not to drive, work at heights, or operate dangerous or heavy equipment when tired or sleepy. Review and reiteration of good sleep hygiene measures should be pursued with any patient. ?Other causes of the patient's symptoms, including circadian rhythm disturbances, an underlying mood disorder, medication effect and/or an underlying medical problem cannot be ruled out based on this test. Clinical correlation is recommended.  ? ?The patient will be notified of the test results. The patient will be seen in follow up in sleep clinic at [facility]. ? ?I certify that I have reviewed the raw data recording prior to the issuance of this report in accordance with the standards of the American Academy of Sleep Medicine (AASM). ? ?INTERPRETING PHYSICIAN:  ? ?Star Age, MD, PhD  ?Board Certified in Neurology and Sleep Medicine ? ?[facility] ?[address], [address] ?[place], [place] [address] ?[phone] ? ? ? ? ? ? ? ? ? ? ? ? ? ? ? ? ? ? ? ? ? ? ?

## 2022-01-15 NOTE — Addendum Note (Signed)
Addended by: Star Age on: 01/15/2022 12:50 PM ? ? Modules accepted: Orders ? ?

## 2022-01-16 ENCOUNTER — Telehealth: Payer: Self-pay | Admitting: *Deleted

## 2022-01-16 NOTE — Telephone Encounter (Signed)
Spoke with patient and discussed sleep study results. Pt aware this SS showed mild OSA, Dr Rexene Alberts recommends new machine in the form of autopap. We discussed that autopap has self adjusting pressures between min and max. Pt does want to switch DME companies. He will do research of [employer] approved companies and will call us back in the next day or so to let us know his choice.  ?

## 2022-01-16 NOTE — Telephone Encounter (Signed)
-----   Message from Star Age, MD sent at 01/15/2022 12:50 PM EDT ----- ?Patient had a home sleep test for reevaluation of his OSA.  His home sleep test from 01/09/2022 indicated mild sleep apnea.  Please verify with the patient that he did not use his CPAP at night.  He has been compliant with his CPAP but has an older machine, I would like to write for new equipment in the form of an AutoPap.  He also wanted to change DME providers as I recall.  Please verify with patient what DME company he would like to go with.  He may be a good candidate for [facility]?  Please advise patient be compliant with treatment and schedule a follow-up within 1 to 3 months after set up. ?

## 2022-01-21 NOTE — Telephone Encounter (Signed)
We have [facility] on file. Secure message with order sent to [facility].  ?

## 2022-01-21 NOTE — Telephone Encounter (Deleted)
I have securely reached out to [facility] w/ [employer] for an update. Will update when I have heard back.  ?

## 2022-01-21 NOTE — Telephone Encounter (Signed)
Pt has called back to inform that he would like to use the same DME ?

## 2022-02-20 IMAGING — CT CT HEAD W/O CM
4 series · 17 of 47 positions shown, 19 images · non-contrast
Comparison: None.

CLINICAL DATA: Stroke suspected.  Neurologic deficit.

EXAM:
CT HEAD WITHOUT CONTRAST
TECHNIQUE: Contiguous axial images were obtained from the base of the skull
through the vertex without intravenous contrast.

[Series 2: head wo · axial · 0.45mm/px · z∈[+456,+586]mm · 7 of 36 slices shown, 9 images]
[im 5/36  brain]
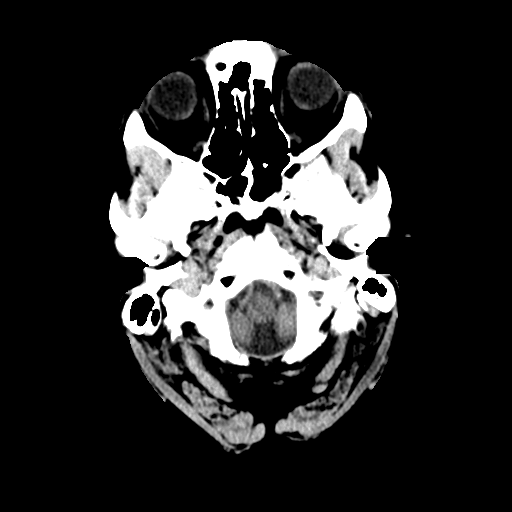
[im 5/36  bone]
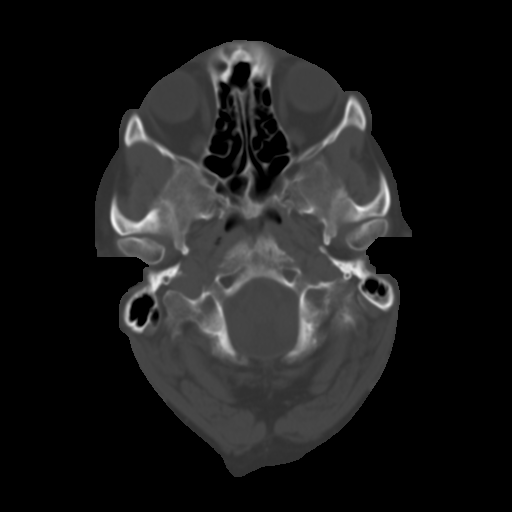
[im 9/36  brain]
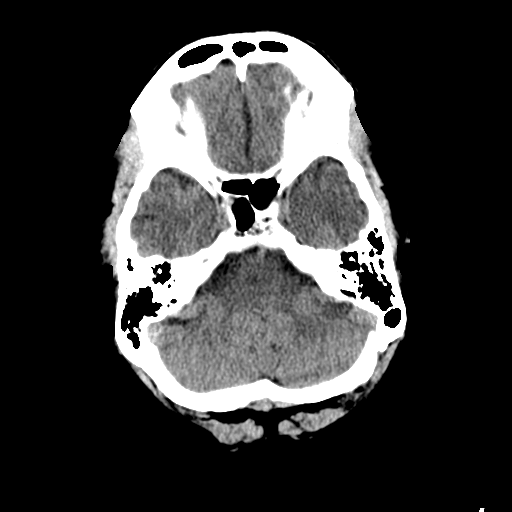
[im 14/36  brain]
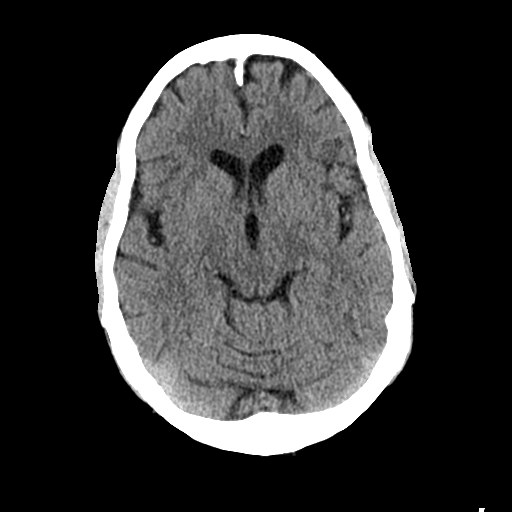
[im 18/36  brain]
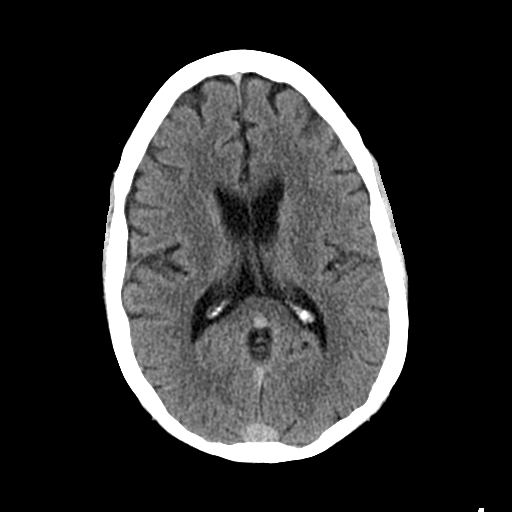
[im 22/36  brain]
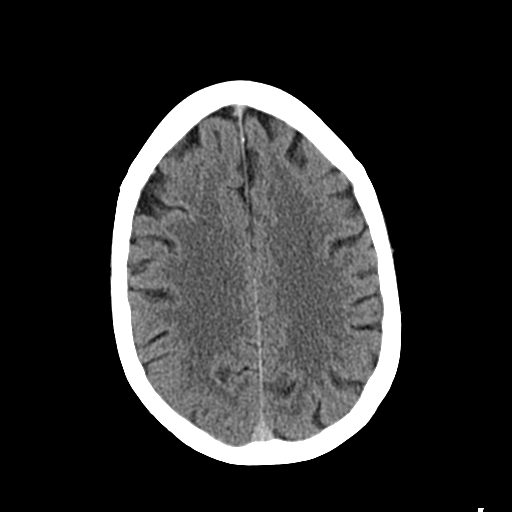
[im 22/36  bone]
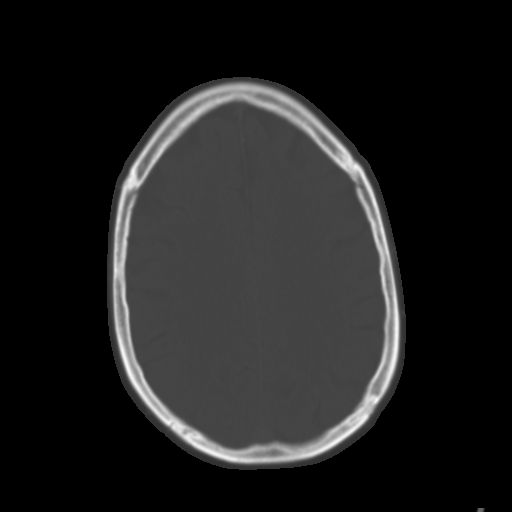
[im 27/36  brain]
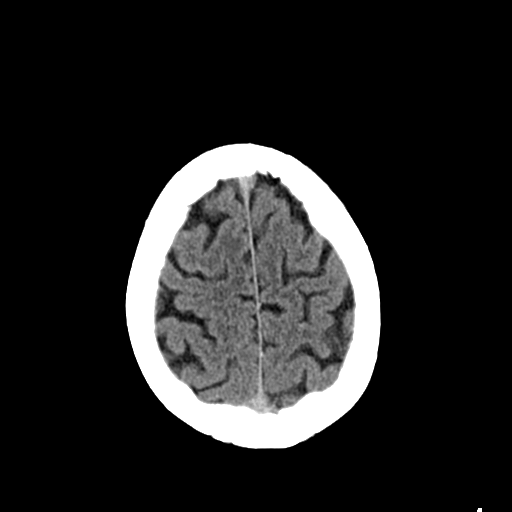
[im 31/36  brain]
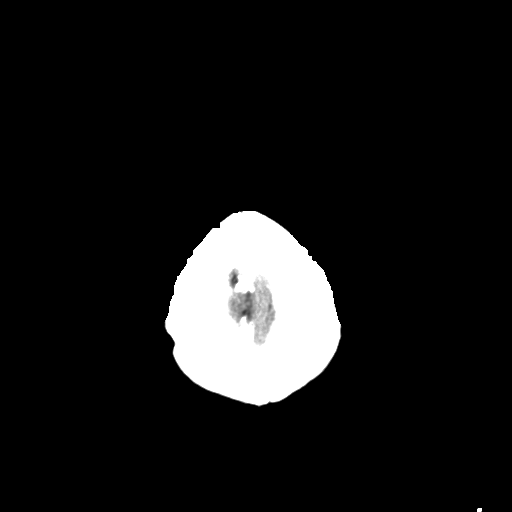

[Series 3: head bone · axial · 0.45mm/px · z∈[+452,+514]mm · 4 of 89 slices shown]
[im 9/89  bone]
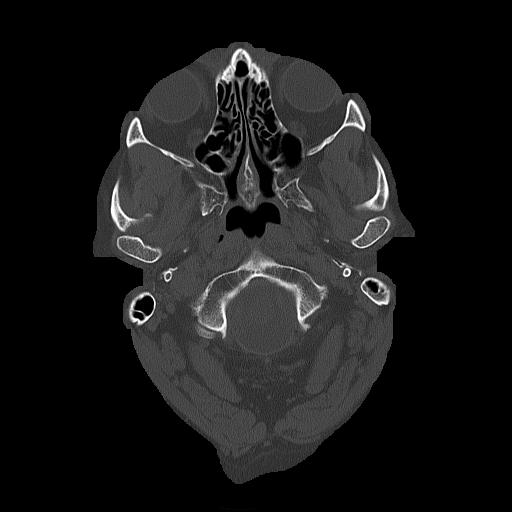
[im 18/89  bone]
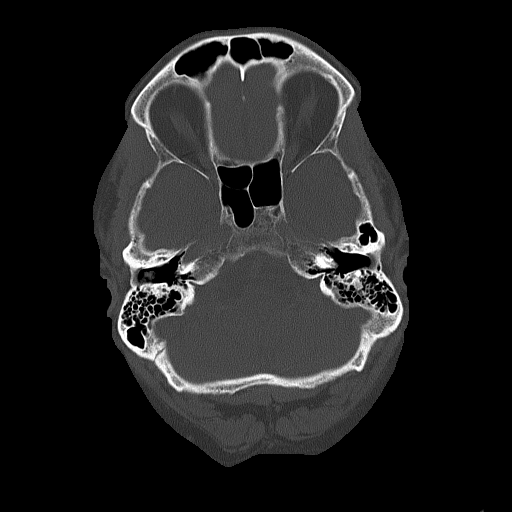
[im 27/89  bone]
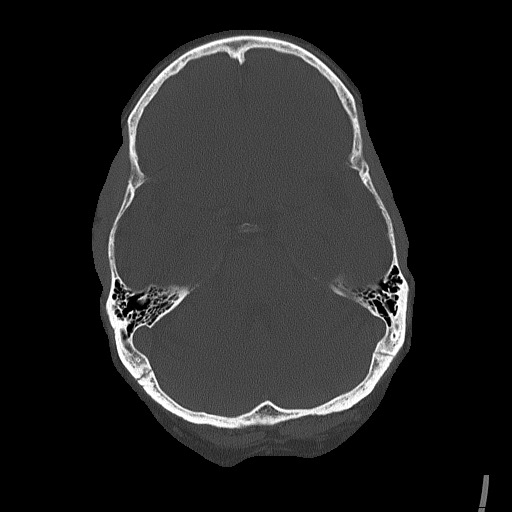
[im 40/89  bone]
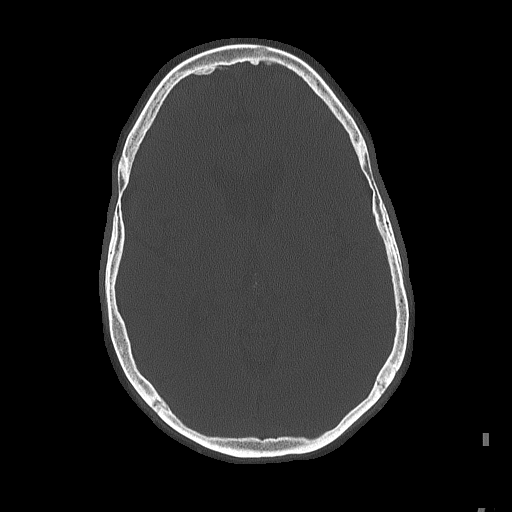

[Series 4: coronal soft tissue · coronal · 0.32mm/px · 3 of 71 slices shown]
[im 24/71  brain]
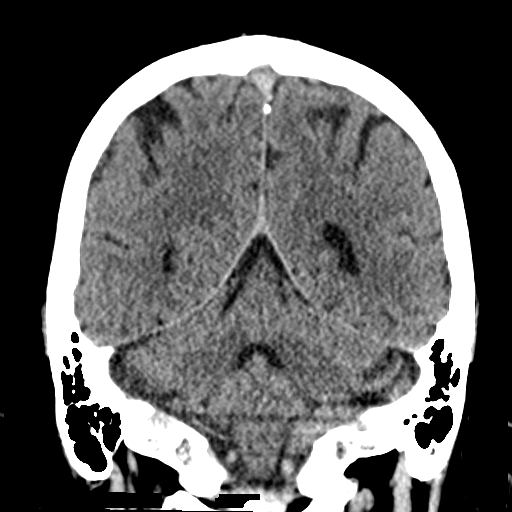
[im 32/71  brain]
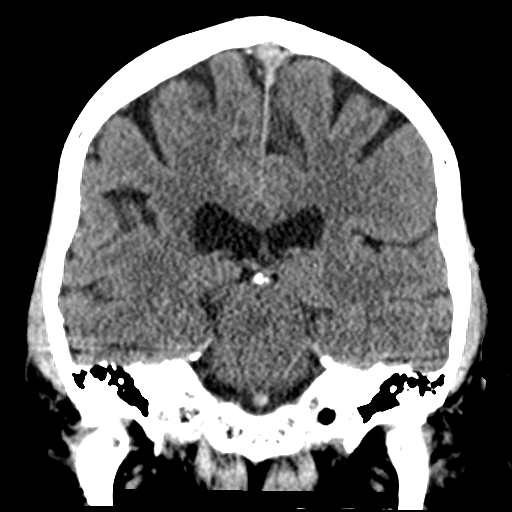
[im 39/71  brain]
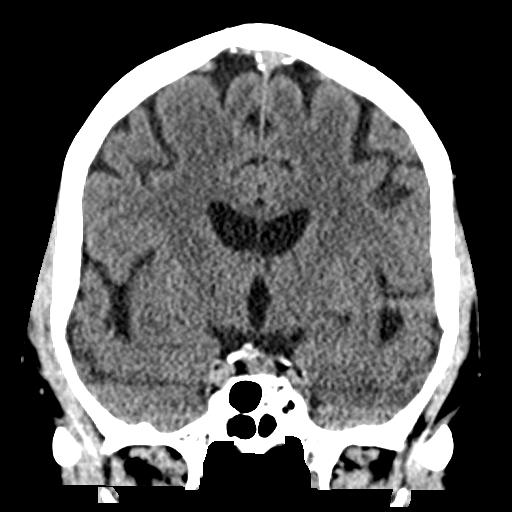

[Series 5: sagittal soft tissue · sagittal · 0.32mm/px · 3 of 55 slices shown]
[im 19/55  brain]
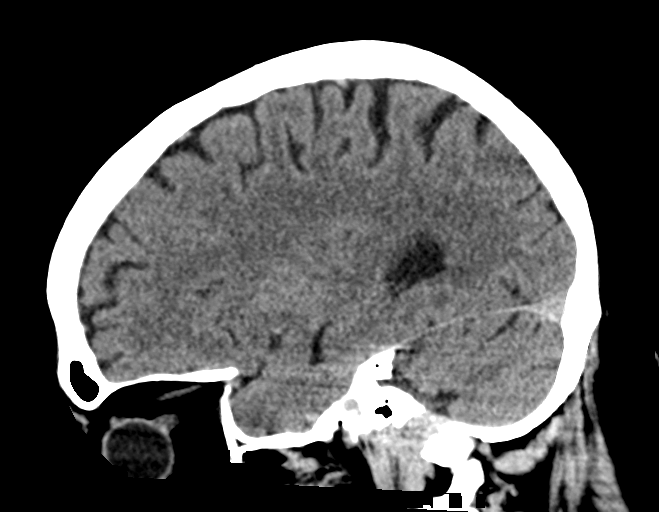
[im 28/55  brain]
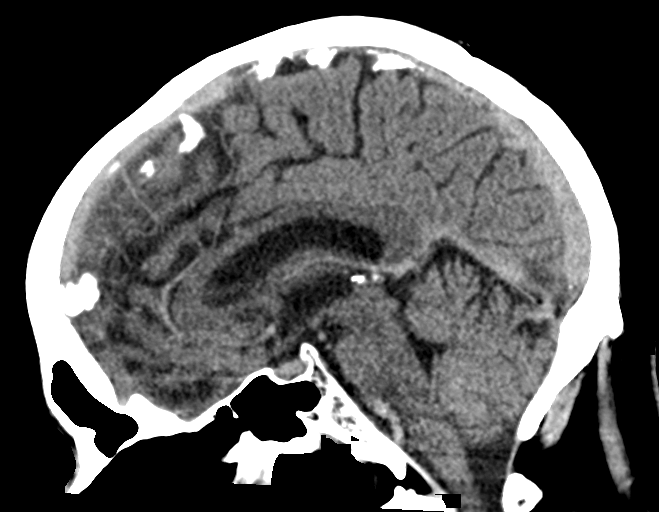
[im 37/55  brain]
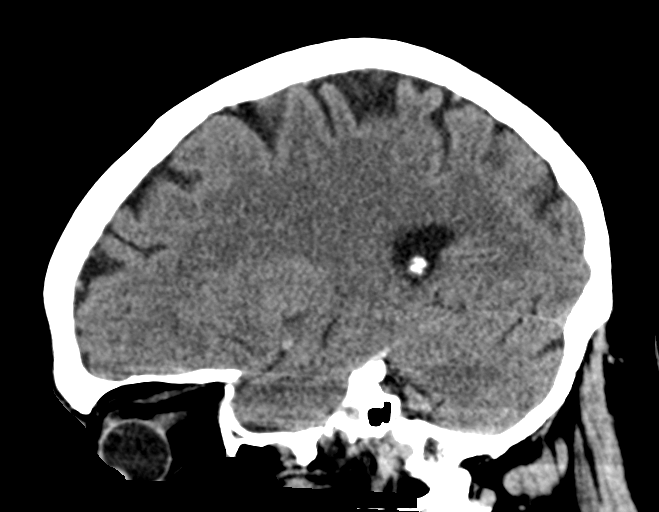

[17 of 47 positions shown; findings below may reference images not displayed]

FINDINGS: Brain: No acute intracranial hemorrhage. No focal mass lesion. No CT
evidence of acute infarction. No midline shift or mass effect. No
hydrocephalus. Basilar cisterns are patent.

Vascular: No hyperdense vessel or unexpected calcification.

Skull: Normal. Negative for fracture or focal lesion.

Sinuses/Orbits: Paranasal sinuses and mastoid air cells are clear.
Orbits are clear.

Other: None.
IMPRESSION: No acute intracranial findings. No CT evidence of cortical
infarction

## 2022-03-26 NOTE — Telephone Encounter (Signed)
Airsense 11 Setup 03/26/22 (appt needed 04/26/22-06/24/22)

## 2022-05-30 ENCOUNTER — Encounter: Payer: Self-pay | Admitting: Oncology

## 2022-05-30 ENCOUNTER — Ambulatory Visit (INDEPENDENT_AMBULATORY_CARE_PROVIDER_SITE_OTHER): Admitting: Neurology

## 2022-05-30 ENCOUNTER — Encounter: Payer: Self-pay | Admitting: Neurology

## 2022-05-30 VITALS — BP 127/81 | HR 72 | Ht 70.0 in | Wt 213.0 lb

## 2022-05-30 DIAGNOSIS — Z9989 Dependence on other enabling machines and devices: Secondary | ICD-10-CM | POA: Diagnosis not present

## 2022-05-30 DIAGNOSIS — G4733 Obstructive sleep apnea (adult) (pediatric): Secondary | ICD-10-CM | POA: Diagnosis not present
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
## 2022-05-30 NOTE — Patient Instructions (Signed)
It was nice to see you again today.  You are compliant with your new AutoPap machine.  Keep up the good work! We can see you in 1 year, you can see one of our nurse practitioners as you are stable.   Please continue using your autoPAP regularly. While your insurance requires that you use PAP at least 4 hours each night on 70% of the nights, I recommend, that you not skip any nights and use it throughout the night if you can. Getting used to PAP and staying with the treatment long term does take time and patience and discipline. Untreated obstructive sleep apnea when it is moderate to severe can have an adverse impact on cardiovascular health and raise her risk for heart disease, arrhythmias, hypertension, congestive heart failure, stroke and diabetes. Untreated obstructive sleep apnea causes sleep disruption, nonrestorative sleep, and sleep deprivation. This can have an impact on your day to day functioning and cause daytime sleepiness and impairment of cognitive function, memory loss, mood disturbance, and problems focussing. Using PAP regularly can improve these symptoms.

## 2022-12-09 ENCOUNTER — Encounter: Payer: Self-pay | Admitting: Internal Medicine

## 2022-12-09 DIAGNOSIS — Z800 Family history of malignant neoplasm of digestive organs: Secondary | ICD-10-CM | POA: Insufficient documentation

## 2022-12-25 ENCOUNTER — Encounter: Payer: Self-pay | Admitting: Internal Medicine

## 2023-01-31 ENCOUNTER — Encounter: Payer: Self-pay | Admitting: Internal Medicine

## 2023-02-06 ENCOUNTER — Encounter

## 2023-02-14 ENCOUNTER — Encounter: Admitting: Internal Medicine

## 2023-05-26 ENCOUNTER — Ambulatory Visit: Admitting: Adult Health

## 2023-10-02 ENCOUNTER — Encounter: Payer: Self-pay | Admitting: *Deleted

## 2023-10-24 ENCOUNTER — Ambulatory Visit: Admitting: Anesthesiology

## 2023-10-24 ENCOUNTER — Encounter
Admission: RE | Disposition: A | Discharge status: Home / Self Care | Payer: Self-pay | Source: Home / Self Care | Attending: Gastroenterology

## 2023-10-24 ENCOUNTER — Encounter: Payer: Self-pay | Admitting: *Deleted

## 2023-10-24 ENCOUNTER — Ambulatory Visit
Admission: RE | Admit: 2023-10-24 | Discharge: 2023-10-24 | Disposition: A | Discharge status: Home / Self Care | Attending: Gastroenterology | Admitting: Gastroenterology

## 2023-10-24 DIAGNOSIS — D124 Benign neoplasm of descending colon: Secondary | ICD-10-CM | POA: Diagnosis not present

## 2023-10-24 DIAGNOSIS — Z79899 Other long term (current) drug therapy: Secondary | ICD-10-CM | POA: Diagnosis not present

## 2023-10-24 DIAGNOSIS — Z87891 Personal history of nicotine dependence: Secondary | ICD-10-CM | POA: Insufficient documentation

## 2023-10-24 DIAGNOSIS — Z1211 Encounter for screening for malignant neoplasm of colon: Secondary | ICD-10-CM | POA: Insufficient documentation

## 2023-10-24 DIAGNOSIS — Z860101 Personal history of adenomatous and serrated colon polyps: Secondary | ICD-10-CM | POA: Diagnosis not present

## 2023-10-24 DIAGNOSIS — I10 Essential (primary) hypertension: Secondary | ICD-10-CM | POA: Insufficient documentation

## 2023-10-24 DIAGNOSIS — K640 First degree hemorrhoids: Secondary | ICD-10-CM | POA: Diagnosis not present

## 2023-10-24 DIAGNOSIS — Z800 Family history of malignant neoplasm of digestive organs: Secondary | ICD-10-CM | POA: Diagnosis not present

## 2023-10-24 DIAGNOSIS — K219 Gastro-esophageal reflux disease without esophagitis: Secondary | ICD-10-CM | POA: Diagnosis not present

## 2023-10-24 DIAGNOSIS — G4733 Obstructive sleep apnea (adult) (pediatric): Secondary | ICD-10-CM | POA: Diagnosis not present

## 2023-10-24 DIAGNOSIS — Z7951 Long term (current) use of inhaled steroids: Secondary | ICD-10-CM | POA: Insufficient documentation

## 2023-10-24 DIAGNOSIS — K648 Other hemorrhoids: Secondary | ICD-10-CM | POA: Insufficient documentation

## 2023-10-24 DIAGNOSIS — J432 Centrilobular emphysema: Secondary | ICD-10-CM | POA: Insufficient documentation

## 2023-10-24 DIAGNOSIS — D122 Benign neoplasm of ascending colon: Secondary | ICD-10-CM | POA: Diagnosis not present

## 2023-10-24 DIAGNOSIS — J449 Chronic obstructive pulmonary disease, unspecified: Secondary | ICD-10-CM | POA: Insufficient documentation

## 2023-10-24 HISTORY — DX: Essential (primary) hypertension: I10

## 2023-10-24 HISTORY — DX: Deficiency of other specified B group vitamins: E53.8

## 2023-10-24 HISTORY — DX: Centrilobular emphysema: J43.2

## 2023-10-24 HISTORY — DX: Spinal stenosis, lumbar region without neurogenic claudication: M48.061

## 2023-10-24 HISTORY — DX: Vitamin D deficiency, unspecified: E55.9

## 2023-10-24 HISTORY — PX: COLONOSCOPY WITH PROPOFOL: SHX5780

## 2023-10-24 HISTORY — DX: Venous insufficiency (chronic) (peripheral): I87.2

## 2023-10-24 HISTORY — DX: Restless legs syndrome: G25.81

## 2023-10-24 SURGERY — COLONOSCOPY WITH PROPOFOL
Anesthesia: General

## 2023-10-24 MED ORDER — LIDOCAINE HCL (CARDIAC) PF 100 MG/5ML IV SOSY
PREFILLED_SYRINGE | INTRAVENOUS | Status: DC | PRN
Start: 2023-10-24 — End: 2023-10-24
  Administered 2023-10-24: 80 mg via INTRAVENOUS

## 2023-10-24 MED ORDER — EPHEDRINE 5 MG/ML INJ
INTRAVENOUS | Status: AC
  Filled 2023-10-24: qty 5

## 2023-10-24 MED ORDER — PROPOFOL 10 MG/ML IV BOLUS
INTRAVENOUS | Status: DC | PRN
  Administered 2023-10-24: 30 mg via INTRAVENOUS
  Administered 2023-10-24: 50 mg via INTRAVENOUS

## 2023-10-24 MED ORDER — PROPOFOL 500 MG/50ML IV EMUL
INTRAVENOUS | Status: DC | PRN
  Administered 2023-10-24: 75 ug/kg/min via INTRAVENOUS

## 2023-10-24 MED ORDER — EPHEDRINE SULFATE-NACL 50-0.9 MG/10ML-% IV SOSY
PREFILLED_SYRINGE | INTRAVENOUS | Status: DC | PRN
  Administered 2023-10-24: 10 mg via INTRAVENOUS
  Administered 2023-10-24: 15 mg via INTRAVENOUS

## 2023-10-24 MED ORDER — SODIUM CHLORIDE 0.9 % IV SOLN: INTRAVENOUS | Status: DC

## 2023-10-24 MED ORDER — LIDOCAINE HCL (PF) 2 % IJ SOLN
INTRAMUSCULAR | Status: AC
  Filled 2023-10-24: qty 5
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
## 2023-10-24 NOTE — Interval H&P Note (Signed)
 History and Physical Interval Note:  10/24/2023 8:36 AM  Ralph [name]  has presented today for surgery, with the diagnosis of HX OF ADENOMATOUS POLYP OF COLON.  The various methods of treatment have been discussed with the patient and family. After consideration of risks, benefits and other options for treatment, the patient has consented to  Procedure(s): COLONOSCOPY WITH PROPOFOL  (N/A) as a surgical intervention.  The patient's history has been reviewed, patient examined, no change in status, stable for surgery.  I have reviewed the patient's chart and labs.  Questions were answered to the patient's satisfaction.     [name]  Ok to proceed with colonoscopy
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
## 2023-10-24 NOTE — Op Note (Signed)
 [facility]astroenterology Patient Name: Ralph [name] Procedure Date: 10/24/2023 8:33 AM MRN: [id number] Account #: [id number] Date of Birth: [birth year] Admit Type: Outpatient Age: 63 Room: [facility] ENDO ROOM 3 Gender: Male Note Status: Finalized Instrument Name: Arvis [id number] Procedure:             Colonoscopy Indications:           Screening in patient at increased risk: Family history                         of 1st-degree relative with colorectal cancer before                         age 47 years, Surveillance: Personal history of                         adenomatous polyps on last colonoscopy 5 years ago Providers:             Ole Schick MD, MD Referring MD:          Ole Schick MD, MD (Referring MD) Medicines:             Monitored Anesthesia Care Complications:         No immediate complications. Estimated blood loss:                         Minimal. Procedure:             Pre-Anesthesia Assessment:                        - Prior to the procedure, a History and Physical was                         performed, and patient medications and allergies were                         reviewed. The patient is competent. The risks and                         benefits of the procedure and the sedation options and                         risks were discussed with the patient. All questions                         were answered and informed consent was obtained.                         Patient identification and proposed procedure were                         verified by the physician, the nurse, the                         anesthesiologist, the anesthetist and the technician                         in the endoscopy suite. Mental Status Examination:  alert and oriented. Airway Examination: normal                         oropharyngeal airway and neck mobility. Respiratory                         Examination: clear to auscultation. CV  Examination:                         normal. Prophylactic Antibiotics: The patient does not                         require prophylactic antibiotics. Prior                         Anticoagulants: The patient has taken no anticoagulant                         or antiplatelet agents. ASA Grade Assessment: III - A                         patient with severe systemic disease. After reviewing                         the risks and benefits, the patient was deemed in                         satisfactory condition to undergo the procedure. The                         anesthesia plan was to use monitored anesthesia care                         (MAC). Immediately prior to administration of                         medications, the patient was re-assessed for adequacy                         to receive sedatives. The heart rate, respiratory                         rate, oxygen saturations, blood pressure, adequacy of                         pulmonary ventilation, and response to care were                         monitored throughout the procedure. The physical                         status of the patient was re-assessed after the                         procedure.                        After obtaining informed consent, the colonoscope was  passed under direct vision. Throughout the procedure,                         the patient's blood pressure, pulse, and oxygen                         saturations were monitored continuously. The                         Colonoscope was introduced through the anus and                         advanced to the the cecum, identified by appendiceal                         orifice and ileocecal valve. The colonoscopy was                         somewhat difficult due to significant looping.                         Successful completion of the procedure was aided by                         applying abdominal pressure. The patient tolerated the                          procedure well. The quality of the bowel preparation                         was adequate to identify polyps. The ileocecal valve,                         appendiceal orifice, and rectum were photographed. Findings:      The perianal and digital rectal examinations were normal.      A 3 mm polyp was found in the ascending colon. The polyp was sessile.       The polyp was removed with a cold snare. Resection and retrieval were       complete. Estimated blood loss was minimal.      A 2 mm polyp was found in the descending colon. The polyp was sessile.       The polyp was removed with a cold snare. Resection and retrieval were       complete. Estimated blood loss was minimal.      Internal hemorrhoids were found during retroflexion. The hemorrhoids       were Grade I (internal hemorrhoids that do not prolapse).      The exam was otherwise without abnormality on direct and retroflexion       views. Impression:            - One 3 mm polyp in the ascending colon, removed with                         a cold snare. Resected and retrieved.                        - One 2 mm polyp in the descending colon, removed with  a cold snare. Resected and retrieved.                        - Internal hemorrhoids.                        - The examination was otherwise normal on direct and                         retroflexion views. Recommendation:        - Discharge patient to home.                        - Resume previous diet.                        - Continue present medications.                        - Await pathology results.                        - Repeat colonoscopy in 5 years for surveillance.                        - Return to referring physician as previously                         scheduled. Procedure Code(s):     --- Professional ---                        [phone], Colonoscopy, flexible; with removal of                         tumor(s), polyp(s), or other  lesion(s) by snare                         technique Diagnosis Code(s):     --- Professional ---                        Z80.0, Family history of malignant neoplasm of                         digestive organs                        Z86.010, Personal history of colonic polyps                        D12.2, Benign neoplasm of ascending colon                        D12.4, Benign neoplasm of descending colon                        K64.0, First degree hemorrhoids CPT copyright 2022 American Medical Association. All rights reserved. The codes documented in this report are preliminary and upon coder review may  be revised to meet current compliance requirements. Ole Schick MD, MD 10/24/2023 9:06:08 AM Number of Addenda: 0 Note Initiated On: 10/24/2023 8:33 AM Scope Withdrawal Time: 0 hours 9 minutes 2 seconds  Total Procedure Duration: 0 hours 17 minutes 26 seconds  Estimated Blood Loss:  Estimated blood loss was minimal.      [facility]

## 2023-10-24 NOTE — Anesthesia Postprocedure Evaluation (Signed)
 Anesthesia Post Note  Patient: Ralph [name]  Procedure(s) Performed: COLONOSCOPY WITH PROPOFOL   Patient location during evaluation: Endoscopy Anesthesia Type: General Level of consciousness: awake and alert Pain management: pain level controlled Vital Signs Assessment: post-procedure vital signs reviewed and stable Respiratory status: spontaneous breathing, nonlabored ventilation, respiratory function stable and patient connected to nasal cannula oxygen Cardiovascular status: blood pressure returned to baseline and stable Postop Assessment: no apparent nausea or vomiting Anesthetic complications: no   No notable events documented.   Last Vitals:  Vitals:   10/24/23 0915 10/24/23 0922  BP: 104/67 100/65  Pulse: 81 77  Resp:    Temp:    SpO2: 94% 94%    Last Pain:  Vitals:   10/24/23 0922  TempSrc:   PainSc: 0-No pain                 Fairy POUR Jelicia Nantz

## 2023-10-24 NOTE — Transfer of Care (Signed)
 Immediate Anesthesia Transfer of Care Note  Patient: Ralph [name]  Procedure(s) Performed: COLONOSCOPY WITH PROPOFOL   Patient Location: PACU  Anesthesia Type:General  Level of Consciousness: sedated  Airway & Oxygen Therapy: Patient Spontanous Breathing  Post-op Assessment: Report given to RN and Post -op Vital signs reviewed and stable  Post vital signs: Reviewed and stable  Last Vitals:  Vitals Value Taken Time  BP    Temp    Pulse    Resp    SpO2      Last Pain:  Vitals:   10/24/23 0749  TempSrc: Temporal  PainSc: 0-No pain         Complications: No notable events documented.

## 2023-10-27 ENCOUNTER — Encounter: Payer: Self-pay | Admitting: Gastroenterology

## 2023-10-27 LAB — SURGICAL PATHOLOGY

## 2023-11-25 ENCOUNTER — Ambulatory Visit (INDEPENDENT_AMBULATORY_CARE_PROVIDER_SITE_OTHER): Payer: No Typology Code available for payment source | Admitting: Podiatry

## 2023-11-25 ENCOUNTER — Encounter: Payer: Self-pay | Admitting: Oncology

## 2023-11-25 ENCOUNTER — Encounter: Payer: Self-pay | Admitting: Podiatry

## 2023-11-25 DIAGNOSIS — M79674 Pain in right toe(s): Secondary | ICD-10-CM | POA: Diagnosis not present

## 2023-11-25 DIAGNOSIS — M21371 Foot drop, right foot: Secondary | ICD-10-CM | POA: Diagnosis not present

## 2023-11-25 DIAGNOSIS — B351 Tinea unguium: Secondary | ICD-10-CM | POA: Diagnosis not present

## 2023-11-25 DIAGNOSIS — M79675 Pain in left toe(s): Secondary | ICD-10-CM

## 2023-11-25 DIAGNOSIS — D2371 Other benign neoplasm of skin of right lower limb, including hip: Secondary | ICD-10-CM
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
## 2024-02-26 ENCOUNTER — Ambulatory Visit: Payer: No Typology Code available for payment source | Admitting: Podiatry

## 2024-05-24 ENCOUNTER — Encounter: Payer: Self-pay | Admitting: *Deleted

## 2024-06-07 ENCOUNTER — Telehealth: Payer: Self-pay | Admitting: Acute Care

## 2024-06-07 DIAGNOSIS — Z87891 Personal history of nicotine dependence: Secondary | ICD-10-CM

## 2024-06-07 DIAGNOSIS — Z122 Encounter for screening for malignant neoplasm of respiratory organs: Secondary | ICD-10-CM

## 2024-06-07 NOTE — Telephone Encounter (Signed)
 Lung Cancer Screening Narrative/Criteria Questionnaire (Cigarette Smokers Only- No Cigars/Pipes/vapes)   Ralph [name]   SDMV:06/16/2024 9:15a [name]        [birth year]   LDCT: 06/17/2024 11:00 [facility]    63 y.o.   Phone: [phone]  Lung Screening Narrative (confirm age 20-77 yrs Medicare / 50-80 yrs Private pay insurance)   Insurance information:T[employer]are   Referring Provider:Danielle Franchot PA   This screening involves an initial phone call with a team member from our program. It is called a shared decision making visit. The initial meeting is required by  insurance and Medicare to make sure you understand the program. This appointment takes about 15-20 minutes to complete. You will complete the screening scan at your scheduled date/time.  This scan takes about 5-10 minutes to complete. You can eat and drink normally before and after the scan.  Criteria questions for Lung Cancer Screening:   Are you a current or former smoker? Former Age began smoking: 63yo   If you are a former smoker, what year did you quit smoking? 2014(within 15 yrs)   To calculate your smoking history, I need an accurate estimate of how many packs of cigarettes you smoked per day and for how many years. (Not just the number of PPD you are now smoking)   Years smoking 37 x Packs per day 2 = Pack years 48   (at least 20 pack yrs)   (Make sure they understand that we need to know how much they have smoked in the past, not just the number of PPD they are smoking now)  Do you have a personal history of cancer?  Yes - (type and when diagnosed - 5 yrs cancer free) Melanoma - dx in 2022, removed. Never received other tx.    Do you have a family history of cancer? Yes  (cancer type and and relative) Father - colon. Brother - Hodgkins Lymphoma   Are you coughing up blood?  No  Have you had unexplained weight loss of 15 lbs or more in the last 6 months? No  It looks like you meet all criteria.  When would be a good time for  us  to schedule you for this screening?   Additional information: N/A

## 2024-06-16 ENCOUNTER — Encounter: Payer: Self-pay | Admitting: Adult Health

## 2024-06-16 ENCOUNTER — Ambulatory Visit (INDEPENDENT_AMBULATORY_CARE_PROVIDER_SITE_OTHER): Admitting: Adult Health

## 2024-06-16 DIAGNOSIS — Z87891 Personal history of nicotine dependence: Secondary | ICD-10-CM | POA: Diagnosis not present

## 2024-06-16 NOTE — Progress Notes (Signed)
  Virtual Visit via Telephone Note  I connected with Ralph [name] , 06/16/24 9:23 AM by a telemedicine application and verified that I am speaking with the correct person using two identifiers.  Location: Patient: home Provider: home   I discussed the limitations of evaluation and management by telemedicine and the availability of in person appointments. The patient expressed understanding and agreed to proceed.   Shared Decision Making Visit Lung Cancer Screening Program [id number])   Eligibility: 63 y.o. Pack Years Smoking History Calculation = 74 pack years  (# packs/per year x # years smoked) Recent History of coughing up blood  no Unexplained weight loss? no ( >Than 15 pounds within the last 6 months ) Prior History Lung / other cancer no (Diagnosis within the last 5 years already requiring surveillance chest CT Scans). Smoking Status Former Smoker Former Smokers: Years since quit: 11 years  Quit Date: 2014  Visit Components: Discussion included one or more decision making aids. YES Discussion included risk/benefits of screening. YES Discussion included potential follow up diagnostic testing for abnormal scans. YES Discussion included meaning and risk of over diagnosis. YES Discussion included meaning and risk of False Positives. YES Discussion included meaning of total radiation exposure. YES  Counseling Included: Importance of adherence to annual lung cancer LDCT screening. YES Impact of comorbidities on ability to participate in the program. YES Ability and willingness to under diagnostic treatment. YES  Smoking Cessation Counseling: Former Smokers:  Discussed the importance of maintaining cigarette abstinence. yes Diagnosis Code: Personal History of Nicotine Dependence. S12.108 Information about tobacco cessation classes and interventions provided to patient. Yes Patient provided with ticket for LDCT Scan. yes Written Order for Lung Cancer Screening with LDCT  placed in Epic. Yes (CT Chest Lung Cancer Screening Low Dose W/O CM) [id number]  Z12.2-Screening of respiratory organs Z87.891-Personal history of nicotine dependence   [name] 06/16/24

## 2024-06-16 NOTE — Patient Instructions (Signed)

## 2024-06-17 ENCOUNTER — Ambulatory Visit
Admission: RE | Admit: 2024-06-17 | Discharge: 2024-06-17 | Disposition: A | Discharge status: Home / Self Care | Source: Ambulatory Visit | Attending: Acute Care | Admitting: Acute Care

## 2024-06-17 DIAGNOSIS — Z87891 Personal history of nicotine dependence: Secondary | ICD-10-CM | POA: Diagnosis present

## 2024-06-17 DIAGNOSIS — Z122 Encounter for screening for malignant neoplasm of respiratory organs: Secondary | ICD-10-CM | POA: Diagnosis present

## 2024-07-01 ENCOUNTER — Telehealth: Payer: Self-pay

## 2024-07-01 NOTE — Telephone Encounter (Signed)
 Call Report  IMPRESSION: Lung-RADS 4A, suspicious. Follow up low-dose chest CT without contrast in 3 months (please use the following order, CT CHEST LCS NODULE FOLLOW-UP W/O CM) is recommended. 18.2 mm elongated consolidative opacity in the anteromedial right upper lobe was present on a previous study from 6 years ago although the entire lesion was not included on that exam. Given that this appears to have been present for such a long period of time it is likely benign scarring but warrants close follow-up. Alternatively, PET may be considered when there is a solid component 8mm or larger.   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

## 2024-07-02 ENCOUNTER — Telehealth: Payer: Self-pay | Admitting: Acute Care

## 2024-07-02 NOTE — Telephone Encounter (Signed)
 3 month follow up is fine here. Due 09/17/2024. If it has grown on follow up will need nodule consult with Dr. Shelah. Please let PCP know. Thanks so much

## 2024-07-05 ENCOUNTER — Other Ambulatory Visit: Payer: Self-pay

## 2024-07-05 DIAGNOSIS — Z87891 Personal history of nicotine dependence: Secondary | ICD-10-CM

## 2024-07-05 DIAGNOSIS — Z122 Encounter for screening for malignant neoplasm of respiratory organs: Secondary | ICD-10-CM

## 2024-07-05 DIAGNOSIS — F17210 Nicotine dependence, cigarettes, uncomplicated: Secondary | ICD-10-CM

## 2024-07-05 DIAGNOSIS — R911 Solitary pulmonary nodule: Secondary | ICD-10-CM

## 2024-07-05 NOTE — Telephone Encounter (Signed)
 Pt returned call. Reviewed recent Lung CT results. He is in agreement to complete a 3 month follow up scan to evaluate the 18.2 mm nodule. Order placed. Results and plan to PCP.

## 2024-07-05 NOTE — Telephone Encounter (Signed)
 LVM to call office and review recent Lung CT results.

## 2024-07-05 NOTE — Telephone Encounter (Signed)
 See provider note 07/02/2024, plan is for 3 month f/u scan.

## 2024-07-28 ENCOUNTER — Other Ambulatory Visit: Payer: Self-pay | Admitting: Physical Medicine & Rehabilitation

## 2024-07-28 DIAGNOSIS — G8929 Other chronic pain: Secondary | ICD-10-CM

## 2024-08-06 ENCOUNTER — Encounter: Payer: Self-pay | Admitting: Oncology

## 2024-08-13 ENCOUNTER — Other Ambulatory Visit: Payer: Self-pay | Admitting: Physical Medicine & Rehabilitation

## 2024-08-13 DIAGNOSIS — G8929 Other chronic pain: Secondary | ICD-10-CM

## 2024-08-18 ENCOUNTER — Ambulatory Visit
Admission: RE | Admit: 2024-08-18 | Discharge: 2024-08-18 | Disposition: A | Discharge status: Home / Self Care | Source: Ambulatory Visit | Attending: Physical Medicine & Rehabilitation | Admitting: Physical Medicine & Rehabilitation

## 2024-08-18 DIAGNOSIS — G8929 Other chronic pain: Secondary | ICD-10-CM

## 2024-09-17 ENCOUNTER — Ambulatory Visit
Admission: RE | Admit: 2024-09-17 | Discharge: 2024-09-17 | Disposition: A | Source: Ambulatory Visit | Attending: Acute Care | Admitting: Acute Care

## 2024-09-17 DIAGNOSIS — F17210 Nicotine dependence, cigarettes, uncomplicated: Secondary | ICD-10-CM

## 2024-09-17 DIAGNOSIS — Z122 Encounter for screening for malignant neoplasm of respiratory organs: Secondary | ICD-10-CM

## 2024-09-17 DIAGNOSIS — R911 Solitary pulmonary nodule: Secondary | ICD-10-CM

## 2024-09-17 DIAGNOSIS — Z87891 Personal history of nicotine dependence: Secondary | ICD-10-CM

## 2024-09-29 ENCOUNTER — Telehealth: Payer: Self-pay | Admitting: Acute Care

## 2024-09-29 DIAGNOSIS — R911 Solitary pulmonary nodule: Secondary | ICD-10-CM

## 2024-09-29 NOTE — Telephone Encounter (Signed)
 Lets do a 6 month follow up on this scan.  Please fax PCP and let them know the plan He has CAD, x 3 vessels>> on statin>> sees cards Large 9.9 cm right adrenal myelolipoma.  Thanks so much

## 2024-09-29 NOTE — Addendum Note (Signed)
 Addended by: RHETT KELLY POUR on: 09/29/2024 04:29 PM   Modules accepted: Orders

## 2024-09-29 NOTE — Telephone Encounter (Signed)
 Called and spoke to pt. Informed him of the results of his LDCT. Patient is aware of all results and is agreeable to 6 month follow up scan. Order placed. Results and plan sent to PCP.    IMPRESSION: 1. Lung-RADS 2, benign appearance or behavior. Continue annual screening with low-dose chest CT without contrast in 12 months. 2. Three-vessel coronary atherosclerosis. 3. Cholelithiasis. 4. Stable large 9.9 cm right adrenal myelolipoma. 5. Aortic Atherosclerosis (ICD10-I70.0) and Emphysema (ICD10-J43.9).     Electronically Signed   By: Selinda DELENA Blue M.D.   On: 09/27/2024 10:43
